# Patient Record
Sex: Female | Born: 1980 | Race: White | Hispanic: No | Marital: Married | Smoking: Never smoker
Health system: Southern US, Community
[De-identification: ages and names within clinical notes are randomized; demographics above are authoritative.]

## PROBLEM LIST (undated history)

## (undated) DIAGNOSIS — R51 Headache: Secondary | ICD-10-CM

## (undated) DIAGNOSIS — R519 Headache, unspecified: Secondary | ICD-10-CM

## (undated) DIAGNOSIS — Z8619 Personal history of other infectious and parasitic diseases: Secondary | ICD-10-CM

## (undated) HISTORY — DX: Headache: R51

## (undated) HISTORY — PX: NASAL FRACTURE SURGERY: SHX718

## (undated) HISTORY — DX: Personal history of other infectious and parasitic diseases: Z86.19

## (undated) HISTORY — DX: Headache, unspecified: R51.9

## (undated) HISTORY — PX: DILATION AND CURETTAGE OF UTERUS: SHX78

---

## 2010-10-22 ENCOUNTER — Inpatient Hospital Stay (HOSPITAL_COMMUNITY)
Admission: RE | Admit: 2010-10-22 | Discharge: 2010-10-25 | DRG: 373 | Disposition: A | Discharge status: Home / Self Care | Payer: BC Managed Care – PPO | Source: Ambulatory Visit | Attending: Obstetrics and Gynecology | Admitting: Obstetrics and Gynecology

## 2010-10-22 DIAGNOSIS — O365990 Maternal care for other known or suspected poor fetal growth, unspecified trimester, not applicable or unspecified: Principal | ICD-10-CM | POA: Diagnosis present

## 2010-10-22 DIAGNOSIS — Z3490 Encounter for supervision of normal pregnancy, unspecified, unspecified trimester: Secondary | ICD-10-CM | POA: Diagnosis present

## 2010-10-22 DIAGNOSIS — O99892 Other specified diseases and conditions complicating childbirth: Secondary | ICD-10-CM | POA: Diagnosis present

## 2010-10-22 DIAGNOSIS — L909 Atrophic disorder of skin, unspecified: Secondary | ICD-10-CM | POA: Diagnosis present

## 2010-10-22 DIAGNOSIS — L919 Hypertrophic disorder of the skin, unspecified: Secondary | ICD-10-CM | POA: Diagnosis present

## 2010-10-22 LAB — CBC
MCH: 32 pg (ref 26.0–34.0)
MCHC: 35.8 g/dL (ref 30.0–36.0)
MCV: 89.3 fL (ref 78.0–100.0)
Platelets: 326 10*3/uL (ref 150–400)
RDW: 12.8 % (ref 11.5–15.5)
WBC: 9.4 10*3/uL (ref 4.0–10.5)

## 2010-10-23 ENCOUNTER — Encounter (HOSPITAL_COMMUNITY): Payer: Self-pay | Admitting: Registered Nurse

## 2010-10-23 ENCOUNTER — Other Ambulatory Visit: Payer: Self-pay | Admitting: Obstetrics and Gynecology

## 2010-10-23 NOTE — Anesthesia Preprocedure Evaluation (Addendum)
Anesthesia Evaluation  General Assessment Comment  History of Anesthesia Complications (+) AWARENESS UNDER ANESTHESIA  Airway       Dental   Pulmonary      Cardiovascular    Neuro/Psych  GI/Hepatic/Renal   Endo/Other   Abdominal   Musculoskeletal  Hematology   Peds  Reproductive/Obstetrics          Anesthesia Physical Anesthesia Plan Anesthesia Quick Evaluation

## 2010-10-23 NOTE — Anesthesia Procedure Notes (Signed)
Performed by: Donney Dice D

## 2010-10-24 LAB — CBC
MCH: 31.3 pg (ref 26.0–34.0)
MCHC: 34.8 g/dL (ref 30.0–36.0)
Platelets: 298 10*3/uL (ref 150–400)
RBC: 3.61 MIL/uL — ABNORMAL LOW (ref 3.87–5.11)
RDW: 12.7 % (ref 11.5–15.5)

## 2010-10-24 MED ORDER — DIBUCAINE 1 % RE OINT: TOPICAL_OINTMENT | RECTAL | Status: DC | PRN

## 2010-10-24 MED ORDER — HYDROCORTISONE ACE-PRAMOXINE 1-1 % RE FOAM: 1.0000 | RECTAL | Status: DC | PRN

## 2010-10-24 MED ORDER — ZOLPIDEM TARTRATE 5 MG PO TABS: 5.0000 mg | ORAL_TABLET | Freq: Every evening | ORAL | Status: DC | PRN

## 2010-10-24 MED ORDER — MENTHOL 3 MG MT LOZG: 1.0000 | LOZENGE | OROMUCOSAL | Status: DC | PRN

## 2010-10-24 MED ORDER — METHYLERGONOVINE MALEATE 0.2 MG PO TABS: 0.2000 mg | ORAL_TABLET | ORAL | Status: DC | PRN

## 2010-10-24 MED ORDER — CALCIUM CARBONATE ANTACID 500 MG PO CHEW: 1.0000 | CHEWABLE_TABLET | ORAL | Status: DC | PRN

## 2010-10-24 MED ORDER — WITCH HAZEL-GLYCERIN EX PADS
MEDICATED_PAD | CUTANEOUS | Status: DC | PRN
  Filled 2010-10-24: qty 100

## 2010-10-24 MED ORDER — SODIUM CHLORIDE 0.9 % IJ SOLN
3.0000 mL | INTRAMUSCULAR | Status: DC | PRN
  Filled 2010-10-24: qty 3

## 2010-10-24 MED ORDER — BISACODYL 10 MG RE SUPP: 10.0000 mg | Freq: Every day | RECTAL | Status: DC | PRN

## 2010-10-24 MED ORDER — FLEET ENEMA 7-19 GM/118ML RE ENEM: 1.0000 | ENEMA | Freq: Every day | RECTAL | Status: DC | PRN

## 2010-10-24 MED ORDER — OXYCODONE-ACETAMINOPHEN 5-325 MG PO TABS: 1.0000 | ORAL_TABLET | ORAL | Status: DC | PRN

## 2010-10-24 MED ORDER — SIMETHICONE 80 MG PO CHEW
80.0000 mg | CHEWABLE_TABLET | ORAL | Status: DC | PRN
  Filled 2010-10-24: qty 1

## 2010-10-24 MED ORDER — BENZOCAINE-MENTHOL 20-0.5 % EX AERO: 1.0000 "application " | INHALATION_SPRAY | Freq: Four times a day (QID) | CUTANEOUS | Status: DC | PRN

## 2010-10-24 MED ORDER — FERROUS SULFATE 325 (65 FE) MG PO TABS
325.0000 mg | ORAL_TABLET | Freq: Two times a day (BID) | ORAL | Status: DC
  Administered 2010-10-25: 325 mg via ORAL
  Filled 2010-10-24: qty 1

## 2010-10-24 MED ORDER — GUAIFENESIN 100 MG/5ML PO SOLN: 15.0000 mL | ORAL | Status: DC | PRN

## 2010-10-24 MED ORDER — PSEUDOEPHEDRINE HCL 30 MG PO TABS: 60.0000 mg | ORAL_TABLET | ORAL | Status: DC | PRN

## 2010-10-24 MED ORDER — SODIUM CHLORIDE 0.9 % IJ SOLN
3.0000 mL | Freq: Two times a day (BID) | INTRAMUSCULAR | Status: DC
  Filled 2010-10-24 (×3): qty 3

## 2010-10-24 MED ORDER — DIPHENHYDRAMINE HCL 25 MG PO CAPS: 25.0000 mg | ORAL_CAPSULE | Freq: Four times a day (QID) | ORAL | Status: DC | PRN

## 2010-10-24 MED ORDER — ONDANSETRON HCL 4 MG/2ML IJ SOLN: 4.0000 mg | INTRAMUSCULAR | Status: DC | PRN

## 2010-10-24 MED ORDER — METHYLERGONOVINE MALEATE 0.2 MG/ML IJ SOLN: 0.2000 mg | Freq: Four times a day (QID) | INTRAMUSCULAR | Status: DC | PRN

## 2010-10-24 MED ORDER — PRENATAL 27-0.8 MG PO TABS
1.0000 | ORAL_TABLET | Freq: Every day | ORAL | Status: DC
  Administered 2010-10-25: 1 via ORAL
  Filled 2010-10-24 (×3): qty 1

## 2010-10-24 MED ORDER — ONDANSETRON HCL 4 MG PO TABS: 4.0000 mg | ORAL_TABLET | ORAL | Status: DC | PRN

## 2010-10-24 MED ORDER — MAGNESIUM HYDROXIDE 400 MG/5ML PO SUSP: 30.0000 mL | ORAL | Status: DC | PRN

## 2010-10-24 MED ORDER — OXYTOCIN 20 UNITS IN LACTATED RINGERS INFUSION - SIMPLE: 125.0000 mL/h | INTRAVENOUS | Status: DC | PRN

## 2010-10-24 MED ORDER — FLEET ENEMA 7-19 GM/118ML RE ENEM
1.0000 | ENEMA | Freq: Every day | RECTAL | Status: DC | PRN
  Filled 2010-10-24: qty 1

## 2010-10-24 MED ORDER — MEASLES, MUMPS & RUBELLA VAC ~~LOC~~ INJ
0.5000 mL | INJECTION | Freq: Once | SUBCUTANEOUS | Status: DC | PRN
  Filled 2010-10-24: qty 0.5

## 2010-10-24 MED ORDER — SENNOSIDES-DOCUSATE SODIUM 8.6-50 MG PO TABS
1.0000 | ORAL_TABLET | Freq: Every day | ORAL | Status: DC
  Filled 2010-10-24: qty 2

## 2010-10-24 MED ORDER — IBUPROFEN 600 MG PO TABS
600.0000 mg | ORAL_TABLET | Freq: Four times a day (QID) | ORAL | Status: DC
  Administered 2010-10-25 (×2): 600 mg via ORAL
  Filled 2010-10-24 (×2): qty 1

## 2010-10-25 ENCOUNTER — Encounter (HOSPITAL_COMMUNITY): Payer: Self-pay | Admitting: Obstetrics and Gynecology

## 2010-10-25 DIAGNOSIS — Z3490 Encounter for supervision of normal pregnancy, unspecified, unspecified trimester: Secondary | ICD-10-CM | POA: Diagnosis present

## 2010-10-25 NOTE — Progress Notes (Signed)
Post Partum Day2   Subjective: no complaints  Objective: Blood pressure 117/80, pulse 88, temperature 98.2 F (36.8 C), temperature source Oral, resp. rate 18, height 5\' 4"  (1.626 m), weight 66.8 kg (147 lb 4.3 oz).  Physical Exam:  General: alert Lochia: appropriate Uterine Fundus: firm  DVT Evaluation: No evidence of DVT seen on physical exam.   Basename 10/24/10 0605 10/22/10 2040  HGB 11.3* 12.5  HCT 32.5* 34.9*    Assessment/Plan: Discharge home   LOS: 3 days   Jerman Tinnon L 10/25/2010, 10:08 AM

## 2010-10-26 LAB — RH IMMUNE GLOB WKUP(>/=20WKS)(NOT WOMEN'S HOSP)

## 2010-10-29 ENCOUNTER — Inpatient Hospital Stay (HOSPITAL_COMMUNITY): Admission: AD | Admit: 2010-10-29 | Payer: Self-pay | Source: Home / Self Care | Admitting: Obstetrics and Gynecology

## 2010-11-17 ENCOUNTER — Inpatient Hospital Stay (HOSPITAL_COMMUNITY): Admission: AD | Admit: 2010-11-17 | Payer: Self-pay | Source: Ambulatory Visit | Admitting: Obstetrics and Gynecology

## 2013-03-06 ENCOUNTER — Ambulatory Visit (HOSPITAL_COMMUNITY)
Admission: AD | Admit: 2013-03-06 | Discharge: 2013-03-06 | Disposition: A | Discharge status: Home / Self Care | Payer: BC Managed Care – PPO | Source: Ambulatory Visit | Attending: Obstetrics and Gynecology | Admitting: Obstetrics and Gynecology

## 2014-04-02 LAB — OB RESULTS CONSOLE GC/CHLAMYDIA
Chlamydia: NEGATIVE
GC PROBE AMP, GENITAL: NEGATIVE

## 2014-04-02 LAB — OB RESULTS CONSOLE RPR: RPR: NONREACTIVE

## 2014-04-02 LAB — OB RESULTS CONSOLE HEPATITIS B SURFACE ANTIGEN: Hepatitis B Surface Ag: NEGATIVE

## 2014-04-02 LAB — OB RESULTS CONSOLE HIV ANTIBODY (ROUTINE TESTING): HIV: NONREACTIVE

## 2014-04-02 LAB — OB RESULTS CONSOLE RUBELLA ANTIBODY, IGM: Rubella: NON-IMMUNE/NOT IMMUNE

## 2014-04-02 LAB — OB RESULTS CONSOLE ABO/RH: RH Type: NEGATIVE

## 2014-04-02 LAB — OB RESULTS CONSOLE ANTIBODY SCREEN: ANTIBODY SCREEN: NEGATIVE

## 2014-04-11 ENCOUNTER — Other Ambulatory Visit: Payer: Self-pay | Admitting: Obstetrics and Gynecology

## 2014-04-12 LAB — CYTOLOGY - PAP

## 2014-10-15 LAB — OB RESULTS CONSOLE GBS: GBS: POSITIVE

## 2014-10-31 ENCOUNTER — Encounter (HOSPITAL_COMMUNITY): Payer: Self-pay | Admitting: *Deleted

## 2014-10-31 ENCOUNTER — Telehealth (HOSPITAL_COMMUNITY): Payer: Self-pay | Admitting: *Deleted

## 2014-10-31 NOTE — Telephone Encounter (Signed)
Preadmission screen  

## 2014-11-07 ENCOUNTER — Encounter (HOSPITAL_COMMUNITY): Payer: Self-pay

## 2014-11-07 ENCOUNTER — Inpatient Hospital Stay (HOSPITAL_COMMUNITY)
Admission: RE | Admit: 2014-11-07 | Discharge: 2014-11-08 | DRG: 775 | Disposition: A | Discharge status: Home / Self Care | Payer: BC Managed Care – PPO | Source: Ambulatory Visit | Attending: Obstetrics and Gynecology | Admitting: Obstetrics and Gynecology

## 2014-11-07 ENCOUNTER — Inpatient Hospital Stay (HOSPITAL_COMMUNITY): Payer: BC Managed Care – PPO | Admitting: Anesthesiology

## 2014-11-07 DIAGNOSIS — Z833 Family history of diabetes mellitus: Secondary | ICD-10-CM | POA: Diagnosis not present

## 2014-11-07 DIAGNOSIS — Z3A40 40 weeks gestation of pregnancy: Secondary | ICD-10-CM | POA: Diagnosis present

## 2014-11-07 DIAGNOSIS — Z8249 Family history of ischemic heart disease and other diseases of the circulatory system: Secondary | ICD-10-CM | POA: Diagnosis not present

## 2014-11-07 DIAGNOSIS — Z3490 Encounter for supervision of normal pregnancy, unspecified, unspecified trimester: Secondary | ICD-10-CM

## 2014-11-07 DIAGNOSIS — O9989 Other specified diseases and conditions complicating pregnancy, childbirth and the puerperium: Secondary | ICD-10-CM | POA: Diagnosis present

## 2014-11-07 LAB — CBC
HEMATOCRIT: 36 % (ref 36.0–46.0)
Hemoglobin: 12.5 g/dL (ref 12.0–15.0)
MCH: 30.5 pg (ref 26.0–34.0)
MCHC: 34.7 g/dL (ref 30.0–36.0)
MCV: 87.8 fL (ref 78.0–100.0)
Platelets: 332 10*3/uL (ref 150–400)
RBC: 4.1 MIL/uL (ref 3.87–5.11)
RDW: 13.1 % (ref 11.5–15.5)
WBC: 7.1 10*3/uL (ref 4.0–10.5)

## 2014-11-07 LAB — RPR: RPR Ser Ql: NONREACTIVE

## 2014-11-07 MED ORDER — DIPHENHYDRAMINE HCL 25 MG PO CAPS: 25.0000 mg | ORAL_CAPSULE | Freq: Four times a day (QID) | ORAL | Status: DC | PRN

## 2014-11-07 MED ORDER — EPHEDRINE 5 MG/ML INJ
10.0000 mg | INTRAVENOUS | Status: DC | PRN
  Filled 2014-11-07: qty 2

## 2014-11-07 MED ORDER — PRENATAL MULTIVITAMIN CH
1.0000 | ORAL_TABLET | Freq: Every day | ORAL | Status: DC
  Administered 2014-11-08: 1 via ORAL
  Filled 2014-11-07: qty 1

## 2014-11-07 MED ORDER — ONDANSETRON HCL 4 MG/2ML IJ SOLN: 4.0000 mg | Freq: Four times a day (QID) | INTRAMUSCULAR | Status: DC | PRN

## 2014-11-07 MED ORDER — OXYTOCIN BOLUS FROM INFUSION: 500.0000 mL | INTRAVENOUS | Status: DC

## 2014-11-07 MED ORDER — LACTATED RINGERS IV SOLN
INTRAVENOUS | Status: DC
  Administered 2014-11-07: 950 mL via INTRAVENOUS

## 2014-11-07 MED ORDER — FENTANYL 2.5 MCG/ML BUPIVACAINE 1/10 % EPIDURAL INFUSION (WH - ANES)
14.0000 mL/h | INTRAMUSCULAR | Status: DC | PRN
  Administered 2014-11-07: 14 mL/h via EPIDURAL
  Filled 2014-11-07: qty 125

## 2014-11-07 MED ORDER — OXYCODONE-ACETAMINOPHEN 5-325 MG PO TABS: 1.0000 | ORAL_TABLET | ORAL | Status: DC | PRN

## 2014-11-07 MED ORDER — ZOLPIDEM TARTRATE 5 MG PO TABS: 5.0000 mg | ORAL_TABLET | Freq: Every evening | ORAL | Status: DC | PRN

## 2014-11-07 MED ORDER — IBUPROFEN 600 MG PO TABS
600.0000 mg | ORAL_TABLET | Freq: Four times a day (QID) | ORAL | Status: DC
  Administered 2014-11-07 – 2014-11-08 (×4): 600 mg via ORAL
  Filled 2014-11-07 (×4): qty 1

## 2014-11-07 MED ORDER — DIBUCAINE 1 % RE OINT: 1.0000 "application " | TOPICAL_OINTMENT | RECTAL | Status: DC | PRN

## 2014-11-07 MED ORDER — LIDOCAINE HCL (PF) 1 % IJ SOLN
30.0000 mL | INTRAMUSCULAR | Status: DC | PRN
  Filled 2014-11-07: qty 30

## 2014-11-07 MED ORDER — FLEET ENEMA 7-19 GM/118ML RE ENEM: 1.0000 | ENEMA | RECTAL | Status: DC | PRN

## 2014-11-07 MED ORDER — ACETAMINOPHEN 325 MG PO TABS: 650.0000 mg | ORAL_TABLET | ORAL | Status: DC | PRN

## 2014-11-07 MED ORDER — TETANUS-DIPHTH-ACELL PERTUSSIS 5-2.5-18.5 LF-MCG/0.5 IM SUSP: 0.5000 mL | Freq: Once | INTRAMUSCULAR | Status: DC

## 2014-11-07 MED ORDER — OXYCODONE-ACETAMINOPHEN 5-325 MG PO TABS: 2.0000 | ORAL_TABLET | ORAL | Status: DC | PRN

## 2014-11-07 MED ORDER — DEXTROSE 5 % IV SOLN
5.0000 10*6.[IU] | Freq: Once | INTRAVENOUS | Status: AC
  Administered 2014-11-07: 5 10*6.[IU] via INTRAVENOUS
  Filled 2014-11-07: qty 5

## 2014-11-07 MED ORDER — TERBUTALINE SULFATE 1 MG/ML IJ SOLN
0.2500 mg | Freq: Once | INTRAMUSCULAR | Status: DC | PRN
  Filled 2014-11-07: qty 1

## 2014-11-07 MED ORDER — SIMETHICONE 80 MG PO CHEW: 80.0000 mg | CHEWABLE_TABLET | ORAL | Status: DC | PRN

## 2014-11-07 MED ORDER — LANOLIN HYDROUS EX OINT: TOPICAL_OINTMENT | CUTANEOUS | Status: DC | PRN

## 2014-11-07 MED ORDER — LACTATED RINGERS IV SOLN
500.0000 mL | INTRAVENOUS | Status: DC | PRN
  Administered 2014-11-07: 1000 mL via INTRAVENOUS

## 2014-11-07 MED ORDER — BENZOCAINE-MENTHOL 20-0.5 % EX AERO
1.0000 "application " | INHALATION_SPRAY | CUTANEOUS | Status: DC | PRN
  Administered 2014-11-07: 1 via TOPICAL
  Filled 2014-11-07: qty 56

## 2014-11-07 MED ORDER — MEDROXYPROGESTERONE ACETATE 150 MG/ML IM SUSP: 150.0000 mg | INTRAMUSCULAR | Status: DC | PRN

## 2014-11-07 MED ORDER — FENTANYL 2.5 MCG/ML BUPIVACAINE 1/10 % EPIDURAL INFUSION (WH - ANES): 14.0000 mL/h | INTRAMUSCULAR | Status: DC | PRN

## 2014-11-07 MED ORDER — CITRIC ACID-SODIUM CITRATE 334-500 MG/5ML PO SOLN: 30.0000 mL | ORAL | Status: DC | PRN

## 2014-11-07 MED ORDER — MEASLES, MUMPS & RUBELLA VAC ~~LOC~~ INJ
0.5000 mL | INJECTION | Freq: Once | SUBCUTANEOUS | Status: DC
  Filled 2014-11-07: qty 0.5

## 2014-11-07 MED ORDER — ONDANSETRON HCL 4 MG PO TABS: 4.0000 mg | ORAL_TABLET | ORAL | Status: DC | PRN

## 2014-11-07 MED ORDER — PHENYLEPHRINE 40 MCG/ML (10ML) SYRINGE FOR IV PUSH (FOR BLOOD PRESSURE SUPPORT)
80.0000 ug | PREFILLED_SYRINGE | INTRAVENOUS | Status: DC | PRN
  Filled 2014-11-07: qty 2
  Filled 2014-11-07: qty 20

## 2014-11-07 MED ORDER — DIPHENHYDRAMINE HCL 50 MG/ML IJ SOLN: 12.5000 mg | INTRAMUSCULAR | Status: DC | PRN

## 2014-11-07 MED ORDER — SENNOSIDES-DOCUSATE SODIUM 8.6-50 MG PO TABS
2.0000 | ORAL_TABLET | ORAL | Status: DC
  Administered 2014-11-08: 2 via ORAL
  Filled 2014-11-07: qty 2

## 2014-11-07 MED ORDER — OXYTOCIN 40 UNITS IN LACTATED RINGERS INFUSION - SIMPLE MED
62.5000 mL/h | INTRAVENOUS | Status: DC
  Administered 2014-11-07: 62.5 mL/h via INTRAVENOUS
  Filled 2014-11-07: qty 1000

## 2014-11-07 MED ORDER — PENICILLIN G POTASSIUM 5000000 UNITS IJ SOLR
2.5000 10*6.[IU] | INTRAVENOUS | Status: DC
  Administered 2014-11-07: 2.5 10*6.[IU] via INTRAVENOUS
  Filled 2014-11-07 (×5): qty 2.5

## 2014-11-07 MED ORDER — ONDANSETRON HCL 4 MG/2ML IJ SOLN: 4.0000 mg | INTRAMUSCULAR | Status: DC | PRN

## 2014-11-07 MED ORDER — WITCH HAZEL-GLYCERIN EX PADS: 1.0000 "application " | MEDICATED_PAD | CUTANEOUS | Status: DC | PRN

## 2014-11-07 MED ORDER — OXYTOCIN 40 UNITS IN LACTATED RINGERS INFUSION - SIMPLE MED
1.0000 m[IU]/min | INTRAVENOUS | Status: DC
  Administered 2014-11-07: 2 m[IU]/min via INTRAVENOUS

## 2014-11-07 NOTE — Progress Notes (Signed)
Pt comfortable w/ epidural  FHT cat 1 Toco Q5 Cvx 5/90/-1  A/P:  Exp mngt

## 2014-11-07 NOTE — H&P (Signed)
Autumn Haney is a 34 y.o. female G4P1 @ 40 wks presenting for IOL.  No ctx, lof or vb.  History OB History    Gravida Para Term Preterm AB TAB SAB Ectopic Multiple Living   4 1 1  2  2   1      Past Medical History  Diagnosis Date  . Normal pregnancy 10/25/2010  . Hx of varicella   . Headache     rare migraines   Past Surgical History  Procedure Laterality Date  . Nasal fracture surgery    . Dilation and curettage of uterus     Family History: family history includes Diabetes in her paternal grandfather; Hypertension in her father. Social History:  reports that she has never smoked. She does not have any smokeless tobacco history on file. She reports that she does not drink alcohol or use illicit drugs.   Prenatal Transfer Tool  Maternal Diabetes: No Genetic Screening: normal Maternal Ultrasounds/Referrals: Abnormal:  Findings:   Other:  Increased NT Fetal Ultrasounds or other Referrals:  Fetal echo - nml Maternal Substance Abuse:  No Significant Maternal Medications:  None Significant Maternal Lab Results:  None Other Comments:  None  ROS  Dilation: 4 Effacement (%): 60 Station: -2 Exam by:: Kijana Estock Blood pressure 123/81, pulse 90, temperature 97.9 F (36.6 C), resp. rate 18, height 5\' 4"  (1.626 m), weight 153 lb (69.4 kg), last menstrual period 01/31/2014. Exam Physical Exam  Gen - NAD Abd - gravid, NT Ext - NT Cvx 4/60/-2 AROM - clear  Prenatal labs: ABO, Rh: A/Negative/-- (12/14 0000) Antibody: Negative (12/14 0000) Rubella: Nonimmune (12/14 0000) RPR: Nonreactive (12/14 0000)  HBsAg: Negative (12/14 0000)  HIV: Non-reactive (12/14 0000)  GBS: Positive (06/27 0000)   Assessment/Plan: Admit AROM/pitocin   Phiona Ramnauth 11/07/2014, 8:09 AM

## 2014-11-07 NOTE — Anesthesia Preprocedure Evaluation (Signed)

## 2014-11-07 NOTE — Anesthesia Postprocedure Evaluation (Signed)
  Anesthesia Post-op Note  Patient: Autumn Haney  Procedure(s) Performed: * No procedures listed *  Patient Location: PACU and Mother/Baby  Anesthesia Type:Epidural  Level of Consciousness: awake, alert , oriented and patient cooperative  Airway and Oxygen Therapy: Patient Spontanous Breathing  Post-op Pain: mild  Post-op Assessment: Post-op Vital signs reviewed, Patient's Cardiovascular Status Stable, Respiratory Function Stable, Patent Airway, No signs of Nausea or vomiting, Adequate PO intake, Pain level controlled, No headache, No backache and Patient able to bend at knees              Post-op Vital Signs: Reviewed and stable  Last Vitals:  Filed Vitals:   11/07/14 1331  BP: 110/76  Pulse: 94  Temp: 36.6 C  Resp: 18    Complications: No apparent anesthesia complications

## 2014-11-07 NOTE — Anesthesia Procedure Notes (Signed)
Epidural Patient location during procedure: OB  Preanesthetic Checklist Completed: patient identified, site marked, surgical consent, pre-op evaluation, timeout performed, IV checked, risks and benefits discussed and monitors and equipment checked  Epidural Patient position: sitting Prep: site prepped and draped and DuraPrep Patient monitoring: continuous pulse ox and blood pressure Approach: midline Location: L3-L4 Injection technique: LOR air  Needle:  Needle type: Tuohy  Needle gauge: 17 G Needle length: 9 cm and 9 Needle insertion depth: 5 cm cm Catheter type: closed end flexible Catheter size: 19 Gauge Catheter at skin depth: 10 cm Test dose: negative  Assessment Events: blood not aspirated, injection not painful, no injection resistance, negative IV test and no paresthesia  Additional Notes Dosing of Epidural:  1st dose, through catheter ............................................Marland Kitchen  Xylocaine 40 mg  2nd dose, through catheter, after waiting 3 minutes........Marland KitchenXylocaine 60 mg    ( 1% Xylo charted as a single dose in Epic Meds for ease of charting; actual dosing was fractionated as above, for saftey's sake)  As each dose occurred, patient was free of IV sx; and patient exhibited no evidence of SA injection.  Patient is more comfortable after epidural dosed. Please see RN's note for documentation of vital signs,and FHR which are stable.  Patient reminded not to try to ambulate with numb legs, and that an RN must be present when she attempts to get up.

## 2014-11-07 NOTE — Progress Notes (Signed)
SVD of vigorous female infant w/ apgars of 9,9.  Placenta delivered spontaneous w/ 3VC.   2nd degree lac repaired w/ 3-0 vicryl rapide.  Fundus firm.  EBL 63cc .

## 2014-11-08 LAB — CBC
HEMATOCRIT: 36.3 % (ref 36.0–46.0)
HEMOGLOBIN: 12.3 g/dL (ref 12.0–15.0)
MCH: 30.1 pg (ref 26.0–34.0)
MCHC: 33.9 g/dL (ref 30.0–36.0)
MCV: 89 fL (ref 78.0–100.0)
PLATELETS: 312 10*3/uL (ref 150–400)
RBC: 4.08 MIL/uL (ref 3.87–5.11)
RDW: 13.1 % (ref 11.5–15.5)
WBC: 9.5 10*3/uL (ref 4.0–10.5)

## 2014-11-08 MED ORDER — RHO D IMMUNE GLOBULIN 1500 UNIT/2ML IJ SOSY
300.0000 ug | PREFILLED_SYRINGE | Freq: Once | INTRAMUSCULAR | Status: AC
  Administered 2014-11-08: 300 ug via INTRAVENOUS
  Filled 2014-11-08: qty 2

## 2014-11-08 NOTE — Discharge Summary (Signed)
Obstetric Discharge Summary Reason for Admission: induction of labor Prenatal Procedures: ultrasound Intrapartum Procedures: spontaneous vaginal delivery Postpartum Procedures: none Complications-Operative and Postpartum: 2 degree perineal laceration HEMOGLOBIN  Date Value Ref Range Status  11/08/2014 12.3 12.0 - 15.0 g/dL Final   HCT  Date Value Ref Range Status  11/08/2014 36.3 36.0 - 46.0 % Final    Physical Exam:  General: alert and cooperative Lochia: appropriate Uterine Fundus: firm Incision: healing well DVT Evaluation: No evidence of DVT seen on physical exam. Negative Homan's sign. No cords or calf tenderness. No significant calf/ankle edema.  Discharge Diagnoses: Term Pregnancy-delivered  Discharge Information: Date: 11/08/2014 Activity: pelvic rest Diet: routine Medications: PNV and Ibuprofen Condition: stable Instructions: refer to practice specific booklet Discharge to: home   Newborn Data: Live born female  Birth Weight: 7 lb 1.6 oz (3220 g) APGAR: 9, 9  Home with mother.  Ronnell Clinger G 11/08/2014, 8:04 AM

## 2014-11-08 NOTE — Lactation Note (Signed)
This note was copied from the chart of [place]. Lactation Consultation Note: experienced BF mom. Reports baby has been latching well but would like [place] to observe latch. Baby was circ'd about 2 hours ago- unwrapped and undressed and latched well with lots of swallows noted. Tucking bottom lip under- reviewed with mom how to untuck it. Mom reports breasts are feeling fuller this afternoon. BF brochure given with resources for support after DC. No further questions at present. To call for assist prn  Patient Name: Autumn Haney [id number] Date: 11/08/2014 Reason for consult: Initial assessment   Maternal Data Formula Feeding for Exclusion: No Does the patient have breastfeeding experience prior to this delivery?: Yes  Feeding Feeding Type: Breast Fed  LATCH Score/Interventions Latch: Grasps breast easily, tongue down, lips flanged, rhythmical sucking.  Audible Swallowing: Spontaneous and intermittent  Type of Nipple: Everted at rest and after stimulation  Comfort (Breast/Nipple): Soft / non-tender     Hold (Positioning): Assistance needed to correctly position infant at breast and maintain latch. Intervention(s): Breastfeeding basics reviewed  LATCH Score: 9  Lactation Tools Discussed/Used     Consult Status Consult Status: Follow-up Date: 11/09/14 Follow-up type: In-patient    Truddie Crumble 11/08/2014, 1:56 PM

## 2014-11-08 NOTE — Progress Notes (Signed)
Post Partum Day 1 Subjective: no complaints, up ad lib, voiding, tolerating PO, + flatus and desres early discharge and baby circ  Objective: Blood pressure 127/73, pulse 86, temperature 98.3 F (36.8 C), temperature source Oral, resp. rate 18, height 5\' 4"  (1.626 m), weight 153 lb (69.4 kg), last menstrual period 01/31/2014, SpO2 100 %, unknown if currently breastfeeding.  Physical Exam:  General: alert and cooperative Lochia: appropriate Uterine Fundus: firm Incision: healing well DVT Evaluation: No evidence of DVT seen on physical exam. Negative Homan's sign. No cords or calf tenderness. No significant calf/ankle edema.   Recent Labs  11/07/14 0735 11/08/14 0610  HGB 12.5 12.3  HCT 36.0 36.3    Assessment/Plan: Discharge home and Circumcision prior to discharge   LOS: 1 day   Louvina Cleary G 11/08/2014, 7:53 AM

## 2014-11-08 NOTE — Plan of Care (Signed)
Problem: Phase II Progression Outcomes Goal: Rh isoimmunization per orders Outcome: Not Met (add Reason) Needs Rhogam in AM

## 2014-11-09 LAB — RH IG WORKUP (INCLUDES ABO/RH)
ABO/RH(D): A NEG
Fetal Screen: NEGATIVE
Gestational Age(Wks): 40
Unit division: 0

## 2014-11-11 LAB — TYPE AND SCREEN
ABO/RH(D): A NEG
ANTIBODY SCREEN: POSITIVE
DAT, IGG: NEGATIVE
UNIT DIVISION: 0
Unit division: 0

## 2014-12-20 ENCOUNTER — Other Ambulatory Visit: Payer: Self-pay | Admitting: Obstetrics and Gynecology

## 2014-12-25 LAB — CYTOLOGY - PAP

## 2019-06-03 ENCOUNTER — Ambulatory Visit: Payer: BC Managed Care – PPO

## 2019-06-05 ENCOUNTER — Ambulatory Visit: Payer: BC Managed Care – PPO | Attending: Internal Medicine

## 2019-06-05 DIAGNOSIS — Z23 Encounter for immunization: Secondary | ICD-10-CM

## 2019-06-05 NOTE — Progress Notes (Signed)
   Covid-19 Vaccination Clinic  Name:  Autumn Haney    MRN: [id number] DOB: 1980-07-16  06/05/2019  [name] was observed post Covid-19 immunization for 15 minutes without incidence. She was provided with Vaccine Information Sheet and instruction to access the V-Safe system.   [name] was instructed to call 911 with any severe reactions post vaccine: Marland Kitchen Difficulty breathing  . Swelling of your face and throat  . A fast heartbeat  . A bad rash all over your body  . Dizziness and weakness    Immunizations Administered    Name Date Dose VIS Date Route   Pfizer COVID-19 Vaccine 06/05/2019  6:35 PM 0.3 mL 03/31/2019 Intramuscular   Manufacturer: [place]   Lot: [id number]   [place]: [id number]

## 2019-06-27 ENCOUNTER — Ambulatory Visit: Payer: BC Managed Care – PPO | Attending: Internal Medicine

## 2019-06-27 DIAGNOSIS — Z23 Encounter for immunization: Secondary | ICD-10-CM

## 2019-06-27 NOTE — Progress Notes (Signed)
   Covid-19 Vaccination Clinic  Name:  Autumn Haney    MRN: [id number] DOB: Mar 14, 1981  06/27/2019  Autumn Haney was observed post Covid-19 immunization for 15 minutes without incident. She was provided with Vaccine Information Sheet and instruction to access the V-Safe system.   Autumn Haney was instructed to call 911 with any severe reactions post vaccine: Marland Kitchen Difficulty breathing  . Swelling of face and throat  . A fast heartbeat  . A bad rash all over body  . Dizziness and weakness   Immunizations Administered    Name Date Dose VIS Date Route   Pfizer COVID-19 Vaccine 06/27/2019 12:13 PM 0.3 mL 03/31/2019 Intramuscular   Manufacturer: [place]   Lot: [id number]   [place]: [id number]

## 2019-06-28 ENCOUNTER — Ambulatory Visit: Payer: BC Managed Care – PPO

## 2019-09-02 ENCOUNTER — Encounter (HOSPITAL_BASED_OUTPATIENT_CLINIC_OR_DEPARTMENT_OTHER): Payer: Self-pay | Admitting: Emergency Medicine

## 2019-09-02 ENCOUNTER — Other Ambulatory Visit: Payer: Self-pay

## 2019-09-02 ENCOUNTER — Inpatient Hospital Stay (HOSPITAL_BASED_OUTPATIENT_CLINIC_OR_DEPARTMENT_OTHER)
Admission: EM | Admit: 2019-09-02 | Discharge: 2019-09-05 | DRG: 066 | Disposition: A | Discharge status: Home / Self Care | Payer: BC Managed Care – PPO | Attending: Internal Medicine | Admitting: Internal Medicine

## 2019-09-02 ENCOUNTER — Emergency Department (HOSPITAL_BASED_OUTPATIENT_CLINIC_OR_DEPARTMENT_OTHER): Payer: BC Managed Care – PPO

## 2019-09-02 DIAGNOSIS — I639 Cerebral infarction, unspecified: Secondary | ICD-10-CM

## 2019-09-02 DIAGNOSIS — I63441 Cerebral infarction due to embolism of right cerebellar artery: Secondary | ICD-10-CM | POA: Diagnosis not present

## 2019-09-02 DIAGNOSIS — Z833 Family history of diabetes mellitus: Secondary | ICD-10-CM

## 2019-09-02 DIAGNOSIS — I63541 Cerebral infarction due to unspecified occlusion or stenosis of right cerebellar artery: Secondary | ICD-10-CM | POA: Diagnosis present

## 2019-09-02 DIAGNOSIS — E876 Hypokalemia: Secondary | ICD-10-CM | POA: Diagnosis not present

## 2019-09-02 DIAGNOSIS — E785 Hyperlipidemia, unspecified: Secondary | ICD-10-CM | POA: Diagnosis present

## 2019-09-02 DIAGNOSIS — Z20822 Contact with and (suspected) exposure to covid-19: Secondary | ICD-10-CM | POA: Diagnosis present

## 2019-09-02 DIAGNOSIS — R55 Syncope and collapse: Secondary | ICD-10-CM | POA: Diagnosis not present

## 2019-09-02 DIAGNOSIS — D75839 Thrombocytosis, unspecified: Secondary | ICD-10-CM | POA: Diagnosis present

## 2019-09-02 DIAGNOSIS — R29700 NIHSS score 0: Secondary | ICD-10-CM | POA: Diagnosis present

## 2019-09-02 DIAGNOSIS — R7989 Other specified abnormal findings of blood chemistry: Secondary | ICD-10-CM | POA: Diagnosis present

## 2019-09-02 DIAGNOSIS — R4781 Slurred speech: Secondary | ICD-10-CM | POA: Diagnosis present

## 2019-09-02 DIAGNOSIS — Z8249 Family history of ischemic heart disease and other diseases of the circulatory system: Secondary | ICD-10-CM

## 2019-09-02 DIAGNOSIS — D509 Iron deficiency anemia, unspecified: Secondary | ICD-10-CM | POA: Diagnosis present

## 2019-09-02 LAB — URINALYSIS, ROUTINE W REFLEX MICROSCOPIC
Bilirubin Urine: NEGATIVE
Glucose, UA: NEGATIVE mg/dL
Hgb urine dipstick: NEGATIVE
Ketones, ur: NEGATIVE mg/dL
Leukocytes,Ua: NEGATIVE
Nitrite: NEGATIVE
Protein, ur: NEGATIVE mg/dL
Specific Gravity, Urine: 1.025 (ref 1.005–1.030)
pH: 6 (ref 5.0–8.0)

## 2019-09-02 LAB — BASIC METABOLIC PANEL
Anion gap: 10 (ref 5–15)
BUN: 16 mg/dL (ref 6–20)
CO2: 23 mmol/L (ref 22–32)
Calcium: 9 mg/dL (ref 8.9–10.3)
Chloride: 102 mmol/L (ref 98–111)
Creatinine, Ser: 0.7 mg/dL (ref 0.44–1.00)
GFR calc Af Amer: >60 mL/min (ref >60)
GFR calc non Af Amer: >60 mL/min (ref >60)
Glucose, Bld: 99 mg/dL (ref 70–99)
Potassium: 3.9 mmol/L (ref 3.5–5.1)
Sodium: 135 mmol/L (ref 135–145)

## 2019-09-02 LAB — PREGNANCY, URINE: Preg Test, Ur: NEGATIVE

## 2019-09-02 LAB — CBC
HCT: 33.9 % — ABNORMAL LOW (ref 36.0–46.0)
Hemoglobin: 10.6 g/dL — ABNORMAL LOW (ref 12.0–15.0)
MCH: 23.5 pg — ABNORMAL LOW (ref 26.0–34.0)
MCHC: 31.3 g/dL (ref 30.0–36.0)
MCV: 75 fL — ABNORMAL LOW (ref 80.0–100.0)
Platelets: 581 10*3/uL — ABNORMAL HIGH (ref 150–400)
RBC: 4.52 MIL/uL (ref 3.87–5.11)
RDW: 14.9 % (ref 11.5–15.5)
WBC: 8.1 10*3/uL (ref 4.0–10.5)
nRBC: 0 % (ref 0.0–0.2)

## 2019-09-02 LAB — CBG MONITORING, ED
Glucose-Capillary: 69 mg/dL — ABNORMAL LOW (ref 70–99)
Glucose-Capillary: 104 mg/dL — ABNORMAL HIGH (ref 70–99)

## 2019-09-02 MED ORDER — SODIUM CHLORIDE 0.9 % IV BOLUS
1000.0000 mL | Freq: Once | INTRAVENOUS | Status: AC
  Administered 2019-09-02: 1000 mL via INTRAVENOUS

## 2019-09-02 MED ORDER — SODIUM CHLORIDE 0.9% FLUSH
3.0000 mL | Freq: Once | INTRAVENOUS | Status: DC
  Filled 2019-09-02: qty 3

## 2019-09-02 MED ORDER — METOCLOPRAMIDE HCL 5 MG/ML IJ SOLN
10.0000 mg | Freq: Once | INTRAMUSCULAR | Status: AC
  Administered 2019-09-02: 10 mg via INTRAVENOUS
  Filled 2019-09-02: qty 2

## 2019-09-02 MED ORDER — IOHEXOL 350 MG/ML SOLN
100.0000 mL | Freq: Once | INTRAVENOUS | Status: AC | PRN
  Administered 2019-09-02: 100 mL via INTRAVENOUS

## 2019-09-02 NOTE — ED Notes (Signed)
Dr. Ralene Bathe notified of CBG 69. 4oz apple juice given after swallow screen completed

## 2019-09-02 NOTE — ED Notes (Signed)
ED Provider at bedside. 

## 2019-09-02 NOTE — ED Triage Notes (Signed)
Patient states that at about 10 am she felt light head and felt like she was going to pass out. The patient reports that since she has been lightheaded and dizzy. The patient states that at 10 am she had some slurred speech but that has resolved. Denies any numbness or tingling. Denies any vomiting at this time.

## 2019-09-02 NOTE — ED Provider Notes (Signed)
[facility] EMERGENCY DEPARTMENT Provider Note   CSN: [id number] Arrival date & time: 09/02/19  1805     History Chief Complaint  Patient presents with  . Near Syncope    Autumn Haney is a 39 y.o. female.  The history is provided by the patient. No language interpreter was used.  Near Syncope   Autumn Haney is a 39 y.o. female who presents to the Emergency Department complaining of dizziness. She presents the emergency department complaining of sudden onset dizziness at 10 AM. She had a routine morning where she exercised, showered and ate breakfast. She was sitting down to dry her hair when she suddenly became dizzy and fell like she might pass out. She had slurred speech during the event. It lasted about 45 minutes. During the event she developed associated mild headache located behind her eyes bilaterally as well as pain to the back of her neck. Overall her neck pain is resolved but she does have ongoing mild to moderate headache. Her slurred speech has completely resolved but she continues to feel dizzy described as an off balance sensation. She has no known medical problems and takes no medications. She does drink about 1 to 2 drinks daily. Denies any tobacco or drug use. Symptoms are moderate and constant nature.    Past Medical History:  Diagnosis Date  . Headache    rare migraines  . Hx of varicella   . Normal pregnancy 10/25/2010    Patient Active Problem List   Diagnosis Date Noted  . Pregnancy 11/07/2014  . SVD (spontaneous vaginal delivery) 11/07/2014    Past Surgical History:  Procedure Laterality Date  . DILATION AND CURETTAGE OF UTERUS    . NASAL FRACTURE SURGERY       OB History    Gravida  4   Para  2   Term  2   Preterm      AB  2   Living  2     SAB  2   TAB      Ectopic      Multiple  0   Live Births  2           Family History  Problem Relation Age of Onset  . Hypertension Father   . Diabetes Paternal  Grandfather     Social History   Tobacco Use  . Smoking status: Never Smoker  Substance Use Topics  . Alcohol use: No  . Drug use: No    Home Medications Prior to Admission medications   Medication Sig Start Date End Date Taking? Authorizing Provider  prenatal vitamin w/FE, FA (PRENATAL 1 + 1) 27-1 MG TABS Take 1 tablet by mouth daily.      [provider]    Allergies    Patient has no known allergies.  Review of Systems   Review of Systems  Cardiovascular: Positive for near-syncope.  All other systems reviewed and are negative.   Physical Exam Updated Vital Signs BP (!) 142/99 (BP Location: Right Arm)   Pulse 91   Temp 99.6 F (37.6 C) (Oral)   Resp 16   Ht 5\' 4"  (1.626 m)   Wt 61.2 kg   LMP 08/03/2019   SpO2 100%   BMI 23.17 kg/m   Physical Exam Vitals and nursing note reviewed.  Constitutional:      Appearance: She is well-developed.  HENT:     Head: Normocephalic and atraumatic.  Eyes:     Extraocular Movements:  Extraocular movements intact.     Pupils: Pupils are equal, round, and reactive to light.  Cardiovascular:     Rate and Rhythm: Normal rate and regular rhythm.     Heart sounds: No murmur.  Pulmonary:     Effort: Pulmonary effort is normal. No respiratory distress.     Breath sounds: Normal breath sounds.  Abdominal:     Palpations: Abdomen is soft.     Tenderness: There is no abdominal tenderness. There is no guarding or rebound.  Musculoskeletal:        General: No tenderness.  Skin:    General: Skin is warm and dry.     Capillary Refill: Capillary refill takes less than 2 seconds.  Neurological:     Mental Status: She is alert and oriented to person, place, and time.     Comments: No asymmetry of facial movements. Visual fields are grossly intact. 5/5 strength in all four extremities with sensation to light touch intact in all four extremities.  No ataxia on FTN bilaterally. She is ataxic on attempting to stand.  Psychiatric:         Behavior: Behavior normal.     ED Results / Procedures / Treatments   Labs (all labs ordered are listed, but only abnormal results are displayed) Labs Reviewed  CBC - Abnormal; Notable for the following components:      Result Value   Hemoglobin 10.6 (*)    HCT 33.9 (*)    MCV 75.0 (*)    MCH 23.5 (*)    Platelets 581 (*)    All other components within normal limits  CBG MONITORING, ED - Abnormal; Notable for the following components:   Glucose-Capillary 69 (*)    All other components within normal limits  CBG MONITORING, ED - Abnormal; Notable for the following components:   Glucose-Capillary 104 (*)    All other components within normal limits  SARS CORONAVIRUS 2 BY RT PCR (HOSPITAL ORDER, [facility])  BASIC METABOLIC PANEL  URINALYSIS, ROUTINE W REFLEX MICROSCOPIC  PREGNANCY, URINE    EKG EKG Interpretation  Date/Time:  Saturday Sep 02 2019 22:21:30 EDT Ventricular Rate:  96 PR Interval:    QRS Duration: 90 QT Interval:  360 QTC Calculation: 455 R Axis:   55 Text Interpretation: Sinus rhythm RSR' in V1 or V2, right VCD or RVH Borderline T abnormalities, anterior leads no prior available for comparison Confirmed by Quintella Reichert [phone]) on 09/02/2019 10:37:37 PM   Radiology CT Angio Head W or Wo Contrast  Result Date: 09/02/2019 CLINICAL DATA:  Headache with neck pain and dizziness. EXAM: CT ANGIOGRAPHY HEAD AND NECK TECHNIQUE: Multidetector CT imaging of the head and neck was performed using the standard protocol during bolus administration of intravenous contrast. Multiplanar CT image reconstructions and MIPs were obtained to evaluate the vascular anatomy. Carotid stenosis measurements (when applicable) are obtained utilizing NASCET criteria, using the distal internal carotid diameter as the denominator. CONTRAST:  15mL OMNIPAQUE IOHEXOL 350 MG/ML SOLN COMPARISON:  None. FINDINGS: CT HEAD FINDINGS Brain: There is no mass, hemorrhage  or extra-axial collection. The size and configuration of the ventricles and extra-axial CSF spaces are normal. There is a small area of acute to subacute ischemia within the right cerebellum. Brain parenchyma is otherwise normal. Skull: The visualized skull base, calvarium and extracranial soft tissues are normal. Sinuses/Orbits: No fluid levels or advanced mucosal thickening of the visualized paranasal sinuses. No mastoid or middle ear effusion. The orbits are  normal. CTA NECK FINDINGS SKELETON: There is no bony spinal canal stenosis. No lytic or blastic lesion. OTHER NECK: Normal pharynx, larynx and major salivary glands. No cervical lymphadenopathy. Unremarkable thyroid gland. UPPER CHEST: No pneumothorax or pleural effusion. No nodules or masses. AORTIC ARCH: There is no calcific atherosclerosis of the aortic arch. There is no aneurysm, dissection or hemodynamically significant stenosis of the visualized portion of the aorta. Conventional 3 vessel aortic branching pattern. The visualized proximal subclavian arteries are widely patent. RIGHT CAROTID SYSTEM: Normal without aneurysm, dissection or stenosis. LEFT CAROTID SYSTEM: Normal without aneurysm, dissection or stenosis. VERTEBRAL ARTERIES: Left dominant configuration. Both origins are clearly patent. There is no dissection, occlusion or flow-limiting stenosis to the skull base (V1-V3 segments). CTA HEAD FINDINGS POSTERIOR CIRCULATION: --Vertebral arteries: Normal V4 segments. --Inferior cerebellar arteries: Normal. --Basilar artery: Normal. --Superior cerebellar arteries: Normal. --Posterior cerebral arteries (PCA): Normal. The right PCA is partially supplied by a posterior communicating artery (p-comm). ANTERIOR CIRCULATION: --Intracranial internal carotid arteries: Normal. --Anterior cerebral arteries (ACA): Normal. Both A1 segments are present. Patent anterior communicating artery (a-comm). --Middle cerebral arteries (MCA): Normal. VENOUS SINUSES: As  permitted by contrast timing, patent. ANATOMIC VARIANTS: None Review of the MIP images confirms the above findings. IMPRESSION: 1. Small area of acute to subacute ischemia within the right cerebellum. MRI recommended for further evaluation. 2. Normal CTA of the head and neck.  No cerebellar artery occlusion. Electronically Signed   By: Ulyses Jarred M.D.   On: 09/02/2019 23:34   CT Angio Neck W and/or Wo Contrast  Result Date: 09/02/2019 CLINICAL DATA:  Headache with neck pain and dizziness. EXAM: CT ANGIOGRAPHY HEAD AND NECK TECHNIQUE: Multidetector CT imaging of the head and neck was performed using the standard protocol during bolus administration of intravenous contrast. Multiplanar CT image reconstructions and MIPs were obtained to evaluate the vascular anatomy. Carotid stenosis measurements (when applicable) are obtained utilizing NASCET criteria, using the distal internal carotid diameter as the denominator. CONTRAST:  110mL OMNIPAQUE IOHEXOL 350 MG/ML SOLN COMPARISON:  None. FINDINGS: CT HEAD FINDINGS Brain: There is no mass, hemorrhage or extra-axial collection. The size and configuration of the ventricles and extra-axial CSF spaces are normal. There is a small area of acute to subacute ischemia within the right cerebellum. Brain parenchyma is otherwise normal. Skull: The visualized skull base, calvarium and extracranial soft tissues are normal. Sinuses/Orbits: No fluid levels or advanced mucosal thickening of the visualized paranasal sinuses. No mastoid or middle ear effusion. The orbits are normal. CTA NECK FINDINGS SKELETON: There is no bony spinal canal stenosis. No lytic or blastic lesion. OTHER NECK: Normal pharynx, larynx and major salivary glands. No cervical lymphadenopathy. Unremarkable thyroid gland. UPPER CHEST: No pneumothorax or pleural effusion. No nodules or masses. AORTIC ARCH: There is no calcific atherosclerosis of the aortic arch. There is no aneurysm, dissection or hemodynamically  significant stenosis of the visualized portion of the aorta. Conventional 3 vessel aortic branching pattern. The visualized proximal subclavian arteries are widely patent. RIGHT CAROTID SYSTEM: Normal without aneurysm, dissection or stenosis. LEFT CAROTID SYSTEM: Normal without aneurysm, dissection or stenosis. VERTEBRAL ARTERIES: Left dominant configuration. Both origins are clearly patent. There is no dissection, occlusion or flow-limiting stenosis to the skull base (V1-V3 segments). CTA HEAD FINDINGS POSTERIOR CIRCULATION: --Vertebral arteries: Normal V4 segments. --Inferior cerebellar arteries: Normal. --Basilar artery: Normal. --Superior cerebellar arteries: Normal. --Posterior cerebral arteries (PCA): Normal. The right PCA is partially supplied by a posterior communicating artery (p-comm). ANTERIOR CIRCULATION: --Intracranial internal carotid arteries: Normal. --  Anterior cerebral arteries (ACA): Normal. Both A1 segments are present. Patent anterior communicating artery (a-comm). --Middle cerebral arteries (MCA): Normal. VENOUS SINUSES: As permitted by contrast timing, patent. ANATOMIC VARIANTS: None Review of the MIP images confirms the above findings. IMPRESSION: 1. Small area of acute to subacute ischemia within the right cerebellum. MRI recommended for further evaluation. 2. Normal CTA of the head and neck.  No cerebellar artery occlusion. Electronically Signed   By: Ulyses Jarred M.D.   On: 09/02/2019 23:34    Procedures Procedures (including critical care time)  Medications Ordered in ED Medications  sodium chloride 0.9 % bolus 1,000 mL (1,000 mLs Intravenous New Bag/Given 09/02/19 2225)  metoCLOPramide (REGLAN) injection 10 mg (10 mg Intravenous Given 09/02/19 2225)  iohexol (OMNIPAQUE) 350 MG/ML injection 100 mL (100 mLs Intravenous Contrast Given 09/02/19 2305)    ED Course  I have reviewed the triage vital signs and the nursing notes.  Pertinent labs & imaging results that were available  during my care of the patient were reviewed by me and considered in my medical decision making (see chart for details).    MDM Rules/Calculators/A&P                     Patient here for evaluation of dizziness, transient slurred speech that began at 10 AM. She does have some ataxia on attempting to stand but otherwise no focal abnormalities. CTA is negative for occlusion but is concerning for acute to subacute infarct in the right cerebellum. Discussed with Dr. Lorraine Lax, with neurology, who recommends admission to the hospital service for further workup for CVA. Discussed with patient findings of studies recommendation for admission and she is in agreement with treatment plan.  Final Clinical Impression(s) / ED Diagnoses Final diagnoses:  None    Rx / DC Orders ED Discharge Orders    None       Quintella Reichert, MD 09/03/19 0002

## 2019-09-03 ENCOUNTER — Encounter (HOSPITAL_BASED_OUTPATIENT_CLINIC_OR_DEPARTMENT_OTHER): Payer: Self-pay | Admitting: Internal Medicine

## 2019-09-03 DIAGNOSIS — R7989 Other specified abnormal findings of blood chemistry: Secondary | ICD-10-CM | POA: Diagnosis present

## 2019-09-03 DIAGNOSIS — D509 Iron deficiency anemia, unspecified: Secondary | ICD-10-CM | POA: Diagnosis present

## 2019-09-03 DIAGNOSIS — D75839 Thrombocytosis, unspecified: Secondary | ICD-10-CM | POA: Diagnosis present

## 2019-09-03 DIAGNOSIS — R55 Syncope and collapse: Secondary | ICD-10-CM | POA: Diagnosis present

## 2019-09-03 DIAGNOSIS — D473 Essential (hemorrhagic) thrombocythemia: Secondary | ICD-10-CM | POA: Diagnosis not present

## 2019-09-03 DIAGNOSIS — I6389 Other cerebral infarction: Secondary | ICD-10-CM | POA: Diagnosis not present

## 2019-09-03 DIAGNOSIS — Z8249 Family history of ischemic heart disease and other diseases of the circulatory system: Secondary | ICD-10-CM | POA: Diagnosis not present

## 2019-09-03 DIAGNOSIS — Z20822 Contact with and (suspected) exposure to covid-19: Secondary | ICD-10-CM | POA: Diagnosis present

## 2019-09-03 DIAGNOSIS — E785 Hyperlipidemia, unspecified: Secondary | ICD-10-CM | POA: Diagnosis present

## 2019-09-03 DIAGNOSIS — R29700 NIHSS score 0: Secondary | ICD-10-CM | POA: Diagnosis present

## 2019-09-03 DIAGNOSIS — I63441 Cerebral infarction due to embolism of right cerebellar artery: Secondary | ICD-10-CM | POA: Diagnosis present

## 2019-09-03 DIAGNOSIS — I63442 Cerebral infarction due to embolism of left cerebellar artery: Secondary | ICD-10-CM | POA: Diagnosis not present

## 2019-09-03 DIAGNOSIS — E7800 Pure hypercholesterolemia, unspecified: Secondary | ICD-10-CM | POA: Diagnosis not present

## 2019-09-03 DIAGNOSIS — I639 Cerebral infarction, unspecified: Secondary | ICD-10-CM

## 2019-09-03 DIAGNOSIS — I63541 Cerebral infarction due to unspecified occlusion or stenosis of right cerebellar artery: Secondary | ICD-10-CM

## 2019-09-03 DIAGNOSIS — E876 Hypokalemia: Secondary | ICD-10-CM | POA: Diagnosis not present

## 2019-09-03 DIAGNOSIS — R4781 Slurred speech: Secondary | ICD-10-CM | POA: Diagnosis present

## 2019-09-03 DIAGNOSIS — Z833 Family history of diabetes mellitus: Secondary | ICD-10-CM | POA: Diagnosis not present

## 2019-09-03 LAB — CBC WITH DIFFERENTIAL/PLATELET
Abs Immature Granulocytes: 0.01 10*3/uL (ref 0.00–0.07)
Basophils Absolute: 0 10*3/uL (ref 0.0–0.1)
Basophils Relative: 1 %
Eosinophils Absolute: 0.1 10*3/uL (ref 0.0–0.5)
Eosinophils Relative: 1 %
HCT: 30.6 % — ABNORMAL LOW (ref 36.0–46.0)
Hemoglobin: 9.6 g/dL — ABNORMAL LOW (ref 12.0–15.0)
Immature Granulocytes: 0 %
Lymphocytes Relative: 34 %
Lymphs Abs: 2 10*3/uL (ref 0.7–4.0)
MCH: 23.5 pg — ABNORMAL LOW (ref 26.0–34.0)
MCHC: 31.4 g/dL (ref 30.0–36.0)
MCV: 75 fL — ABNORMAL LOW (ref 80.0–100.0)
Monocytes Absolute: 0.6 10*3/uL (ref 0.1–1.0)
Monocytes Relative: 10 %
Neutro Abs: 3.2 10*3/uL (ref 1.7–7.7)
Neutrophils Relative %: 54 %
Platelets: 512 10*3/uL — ABNORMAL HIGH (ref 150–400)
RBC: 4.08 MIL/uL (ref 3.87–5.11)
RDW: 14.9 % (ref 11.5–15.5)
WBC: 5.8 10*3/uL (ref 4.0–10.5)
nRBC: 0 % (ref 0.0–0.2)

## 2019-09-03 LAB — VITAMIN B12: Vitamin B-12: 253 pg/mL (ref 180–914)

## 2019-09-03 LAB — RAPID URINE DRUG SCREEN, HOSP PERFORMED
Amphetamines: NOT DETECTED
Barbiturates: NOT DETECTED
Benzodiazepines: NOT DETECTED
Cocaine: NOT DETECTED
Opiates: NOT DETECTED
Tetrahydrocannabinol: NOT DETECTED

## 2019-09-03 LAB — IRON AND TIBC
Iron: 14 ug/dL — ABNORMAL LOW (ref 28–170)
Saturation Ratios: 3 % — ABNORMAL LOW (ref 10.4–31.8)
TIBC: 529 ug/dL — ABNORMAL HIGH (ref 250–450)
UIBC: 515 ug/dL

## 2019-09-03 LAB — SEDIMENTATION RATE: Sed Rate: 7 mm/hr (ref 0–22)

## 2019-09-03 LAB — ANTITHROMBIN III: AntiThromb III Func: 117 % (ref 75–120)

## 2019-09-03 LAB — FOLATE: Folate: 25.3 ng/mL (ref >5.9)

## 2019-09-03 LAB — C-REACTIVE PROTEIN: CRP: 0.5 mg/dL (ref <1.0)

## 2019-09-03 LAB — CBG MONITORING, ED
Glucose-Capillary: 101 mg/dL — ABNORMAL HIGH (ref 70–99)
Glucose-Capillary: 115 mg/dL — ABNORMAL HIGH (ref 70–99)
Glucose-Capillary: 115 mg/dL — ABNORMAL HIGH (ref 70–99)

## 2019-09-03 LAB — HEMOGLOBIN A1C
Hgb A1c MFr Bld: 5.6 % (ref 4.8–5.6)
Mean Plasma Glucose: 114.02 mg/dL

## 2019-09-03 LAB — SARS CORONAVIRUS 2 BY RT PCR (HOSPITAL ORDER, PERFORMED IN ~~LOC~~ HOSPITAL LAB): SARS Coronavirus 2: NEGATIVE

## 2019-09-03 MED ORDER — ASPIRIN 325 MG PO TABS
325.0000 mg | ORAL_TABLET | Freq: Every day | ORAL | Status: DC
  Administered 2019-09-03: 325 mg via ORAL
  Filled 2019-09-03: qty 1

## 2019-09-03 MED ORDER — ACETAMINOPHEN 160 MG/5ML PO SOLN: 650.0000 mg | ORAL | Status: DC | PRN

## 2019-09-03 MED ORDER — ONDANSETRON HCL 4 MG/2ML IJ SOLN: 4.0000 mg | Freq: Four times a day (QID) | INTRAMUSCULAR | Status: DC | PRN

## 2019-09-03 MED ORDER — ACETAMINOPHEN 325 MG PO TABS
650.0000 mg | ORAL_TABLET | ORAL | Status: DC | PRN
  Administered 2019-09-04: 650 mg via ORAL
  Filled 2019-09-03 (×2): qty 2

## 2019-09-03 MED ORDER — ATORVASTATIN CALCIUM 40 MG PO TABS
40.0000 mg | ORAL_TABLET | Freq: Every day | ORAL | Status: DC
  Administered 2019-09-03: 40 mg via ORAL
  Filled 2019-09-03: qty 1

## 2019-09-03 MED ORDER — SENNOSIDES-DOCUSATE SODIUM 8.6-50 MG PO TABS: 2.0000 | ORAL_TABLET | Freq: Every evening | ORAL | Status: DC | PRN

## 2019-09-03 MED ORDER — ACETAMINOPHEN 650 MG RE SUPP: 650.0000 mg | RECTAL | Status: DC | PRN

## 2019-09-03 MED ORDER — STROKE: EARLY STAGES OF RECOVERY BOOK
Freq: Once | Status: AC
  Administered 2019-09-03: 1
  Filled 2019-09-03: qty 1

## 2019-09-03 NOTE — H&P (Signed)
History and Physical    Autumn Haney [id number] DOB: 1980/05/16 DOA: 09/02/2019  PCP: Patient, No Pcp Per  Patient coming from: Home   Chief Complaint:   Vertigo, slurred speech  HPI:  39 year old female with no past medical history who presents to the telemetry unit at [facility] as a transfer from [facility] after presenting with slurred speech and vertigo.  Patient explains the morning of 5/15 at approximately 9:30 in the morning she began to experience sudden onset of intense vertigo.  This intense vertigo was associated with slurring of her speech.  Patient also noticed significant difficulty in attempting to ambulate due to loss of balance.  Shortly after the onset of the symptoms, patient also began to experience a moderate headache, throbbing in quality located in the frontal region that radiated toward the occipital region.  The wife called her husband who witnessed the majority of the symptoms.  Upon further questioning, patient denies fevers, sick contacts, nausea, vomiting, shortness of breath, chest pain, palpitations.  In the past several weeks to months, patient denies any joint swelling, change in bowel habits, blood in the stool or rashes.  Patient denies any family history of rheumatologic disease.  Patient symptoms continue to persist until approximately 6 PM that evening when she decided to present herself to [place] emergency department.  During patient's evaluation in [place] emergency department CT angiogram of the head neck revealed a small area of acute subacute ischemia in the right cerebellum that was otherwise unremarkable.  Case was discussed with Dr. Lorraine Lax with allergy by the emergency department provider who recommended transfer to [facility] for further work-up.  The hospitalist group was then called to assess the patient as an incoming transfer to the general medical floor.  Of note, patient  symptoms completely resolved and patient returned to baseline close to midnight.   Review of Systems: A 10-system review of systems has been performed and all systems are negative with the exception of what is listed in the HPI.    Past Medical History:  Diagnosis Date  . Headache    rare migraines  . Hx of varicella   . Normal pregnancy 10/25/2010    Past Surgical History:  Procedure Laterality Date  . DILATION AND CURETTAGE OF UTERUS    . NASAL FRACTURE SURGERY       reports that she has never smoked. She has never used smokeless tobacco. She reports that she does not drink alcohol or use drugs.  No Known Allergies  Family History  Problem Relation Age of Onset  . Hypertension Father   . Diabetes Paternal Grandfather      Prior to Admission medications   Medication Sig Start Date End Date Taking? Authorizing Provider  naproxen sodium (ALEVE) 220 MG tablet Take 440 mg by mouth 2 (two) times daily as needed (headache).   Yes [provider]    Physical Exam: Vitals:   09/03/19 1155 09/03/19 1639 09/03/19 1808 09/03/19 1937  BP: 128/81 124/86 129/87 130/89  Pulse: 91 90 82 80  Resp: 16 20 16 18   Temp:  98.7 F (37.1 C) 99.3 F (37.4 C) 98.5 F (36.9 C)  TempSrc:  Oral Oral Oral  SpO2: 100% 99% 100% 100%  Weight:      Height:        Constitutional: Acute alert and oriented x3, no associated distress.   Skin: no rashes, no lesions, good skin turgor noted.  Eyes: Pupils are equally reactive to light.  No evidence of scleral icterus or conjunctival pallor.  ENMT: Moist mucous membranes noted.  Posterior pharynx clear of any exudate or lesions.   Neck: normal, supple, no masses, no thyromegaly.  No evidence of jugular venous distension.   Respiratory: clear to auscultation bilaterally, no wheezing, no crackles. Normal respiratory effort. No accessory muscle use.  Cardiovascular: Regular rate and rhythm, no murmurs / rubs / gallops. No extremity edema. 2+  pedal pulses. No carotid bruits.  Chest:   Nontender without crepitus or deformity.   Back:   Nontender without crepitus or deformity. Abdomen: Abdomen is soft and nontender.  No evidence of intra-abdominal masses.  Positive bowel sounds noted in all quadrants.   Musculoskeletal: No joint deformity upper and lower extremities. Good ROM, no contractures. Normal muscle tone.  Neurologic: CN 2-12 grossly intact. Sensation intact, strength noted to be 5 out of 5 in all 4 extremities.  Patient is following all commands.  Patient is responsive to verbal stimuli.   Psychiatric: Patient presents as a normal mood with appropriate affect.  Patient seems to possess insight as to theircurrent situation.     Labs on Admission: I have personally reviewed following labs and imaging studies -   CBC: Recent Labs  Lab 09/02/19 2122 09/03/19 2025  WBC 8.1 5.8  NEUTROABS  --  3.2  HGB 10.6* 9.6*  HCT 33.9* 30.6*  MCV 75.0* 75.0*  PLT 581* [id number]*   Basic Metabolic Panel: Recent Labs  Lab 09/02/19 2122  NA 135  K 3.9  CL 102  CO2 23  GLUCOSE 99  BUN 16  CREATININE 0.70  CALCIUM 9.0   GFR: Estimated Creatinine Clearance: 82.3 mL/min (by C-G formula based on SCr of 0.7 mg/dL). Liver Function Tests: No results for input(s): AST, ALT, ALKPHOS, BILITOT, PROT, ALBUMIN in the last 168 hours. No results for input(s): LIPASE, AMYLASE in the last 168 hours. No results for input(s): AMMONIA in the last 168 hours. Coagulation Profile: No results for input(s): INR, PROTIME in the last 168 hours. Cardiac Enzymes: No results for input(s): CKTOTAL, CKMB, CKMBINDEX, TROPONINI in the last 168 hours. BNP (last 3 results) No results for input(s): PROBNP in the last 8760 hours. HbA1C: No results for input(s): HGBA1C in the last 72 hours. CBG: Recent Labs  Lab 09/02/19 2215 09/02/19 2329 09/03/19 0340 09/03/19 0643 09/03/19 0859  GLUCAP 69* 104* 101* 115* 115*   Lipid Profile: No results for input(s):  CHOL, HDL, LDLCALC, TRIG, CHOLHDL, LDLDIRECT in the last 72 hours. Thyroid Function Tests: No results for input(s): TSH, T4TOTAL, FREET4, T3FREE, THYROIDAB in the last 72 hours. Anemia Panel: No results for input(s): VITAMINB12, FOLATE, FERRITIN, TIBC, IRON, RETICCTPCT in the last 72 hours. Urine analysis:    Component Value Date/Time   COLORURINE YELLOW 09/02/2019 1839   APPEARANCEUR CLEAR 09/02/2019 1839   LABSPEC 1.025 09/02/2019 1839   PHURINE 6.0 09/02/2019 1839   GLUCOSEU NEGATIVE 09/02/2019 1839   HGBUR NEGATIVE 09/02/2019 1839   BILIRUBINUR NEGATIVE 09/02/2019 1839   KETONESUR NEGATIVE 09/02/2019 1839   PROTEINUR NEGATIVE 09/02/2019 1839   NITRITE NEGATIVE 09/02/2019 1839   LEUKOCYTESUR NEGATIVE 09/02/2019 1839    Radiological Exams on Admission - Personally Reviewed: CT Angio Head W or Wo Contrast  Result Date: 09/02/2019 CLINICAL DATA:  Headache with neck pain and dizziness. EXAM: CT ANGIOGRAPHY HEAD AND NECK TECHNIQUE: Multidetector CT imaging of the head and neck was performed using the standard protocol during bolus administration  of intravenous contrast. Multiplanar CT image reconstructions and MIPs were obtained to evaluate the vascular anatomy. Carotid stenosis measurements (when applicable) are obtained utilizing NASCET criteria, using the distal internal carotid diameter as the denominator. CONTRAST:  179mL OMNIPAQUE IOHEXOL 350 MG/ML SOLN COMPARISON:  None. FINDINGS: CT HEAD FINDINGS Brain: There is no mass, hemorrhage or extra-axial collection. The size and configuration of the ventricles and extra-axial CSF spaces are normal. There is a small area of acute to subacute ischemia within the right cerebellum. Brain parenchyma is otherwise normal. Skull: The visualized skull base, calvarium and extracranial soft tissues are normal. Sinuses/Orbits: No fluid levels or advanced mucosal thickening of the visualized paranasal sinuses. No mastoid or middle ear effusion. The orbits  are normal. CTA NECK FINDINGS SKELETON: There is no bony spinal canal stenosis. No lytic or blastic lesion. OTHER NECK: Normal pharynx, larynx and major salivary glands. No cervical lymphadenopathy. Unremarkable thyroid gland. UPPER CHEST: No pneumothorax or pleural effusion. No nodules or masses. AORTIC ARCH: There is no calcific atherosclerosis of the aortic arch. There is no aneurysm, dissection or hemodynamically significant stenosis of the visualized portion of the aorta. Conventional 3 vessel aortic branching pattern. The visualized proximal subclavian arteries are widely patent. RIGHT CAROTID SYSTEM: Normal without aneurysm, dissection or stenosis. LEFT CAROTID SYSTEM: Normal without aneurysm, dissection or stenosis. VERTEBRAL ARTERIES: Left dominant configuration. Both origins are clearly patent. There is no dissection, occlusion or flow-limiting stenosis to the skull base (V1-V3 segments). CTA HEAD FINDINGS POSTERIOR CIRCULATION: --Vertebral arteries: Normal V4 segments. --Inferior cerebellar arteries: Normal. --Basilar artery: Normal. --Superior cerebellar arteries: Normal. --Posterior cerebral arteries (PCA): Normal. The right PCA is partially supplied by a posterior communicating artery (p-comm). ANTERIOR CIRCULATION: --Intracranial internal carotid arteries: Normal. --Anterior cerebral arteries (ACA): Normal. Both A1 segments are present. Patent anterior communicating artery (a-comm). --Middle cerebral arteries (MCA): Normal. VENOUS SINUSES: As permitted by contrast timing, patent. ANATOMIC VARIANTS: None Review of the MIP images confirms the above findings. IMPRESSION: 1. Small area of acute to subacute ischemia within the right cerebellum. MRI recommended for further evaluation. 2. Normal CTA of the head and neck.  No cerebellar artery occlusion. Electronically Signed   By: Ulyses Jarred M.D.   On: 09/02/2019 23:34   CT Angio Neck W and/or Wo Contrast  Result Date: 09/02/2019 CLINICAL DATA:   Headache with neck pain and dizziness. EXAM: CT ANGIOGRAPHY HEAD AND NECK TECHNIQUE: Multidetector CT imaging of the head and neck was performed using the standard protocol during bolus administration of intravenous contrast. Multiplanar CT image reconstructions and MIPs were obtained to evaluate the vascular anatomy. Carotid stenosis measurements (when applicable) are obtained utilizing NASCET criteria, using the distal internal carotid diameter as the denominator. CONTRAST:  178mL OMNIPAQUE IOHEXOL 350 MG/ML SOLN COMPARISON:  None. FINDINGS: CT HEAD FINDINGS Brain: There is no mass, hemorrhage or extra-axial collection. The size and configuration of the ventricles and extra-axial CSF spaces are normal. There is a small area of acute to subacute ischemia within the right cerebellum. Brain parenchyma is otherwise normal. Skull: The visualized skull base, calvarium and extracranial soft tissues are normal. Sinuses/Orbits: No fluid levels or advanced mucosal thickening of the visualized paranasal sinuses. No mastoid or middle ear effusion. The orbits are normal. CTA NECK FINDINGS SKELETON: There is no bony spinal canal stenosis. No lytic or blastic lesion. OTHER NECK: Normal pharynx, larynx and major salivary glands. No cervical lymphadenopathy. Unremarkable thyroid gland. UPPER CHEST: No pneumothorax or pleural effusion. No nodules or masses. AORTIC ARCH: There  is no calcific atherosclerosis of the aortic arch. There is no aneurysm, dissection or hemodynamically significant stenosis of the visualized portion of the aorta. Conventional 3 vessel aortic branching pattern. The visualized proximal subclavian arteries are widely patent. RIGHT CAROTID SYSTEM: Normal without aneurysm, dissection or stenosis. LEFT CAROTID SYSTEM: Normal without aneurysm, dissection or stenosis. VERTEBRAL ARTERIES: Left dominant configuration. Both origins are clearly patent. There is no dissection, occlusion or flow-limiting stenosis to the  skull base (V1-V3 segments). CTA HEAD FINDINGS POSTERIOR CIRCULATION: --Vertebral arteries: Normal V4 segments. --Inferior cerebellar arteries: Normal. --Basilar artery: Normal. --Superior cerebellar arteries: Normal. --Posterior cerebral arteries (PCA): Normal. The right PCA is partially supplied by a posterior communicating artery (p-comm). ANTERIOR CIRCULATION: --Intracranial internal carotid arteries: Normal. --Anterior cerebral arteries (ACA): Normal. Both A1 segments are present. Patent anterior communicating artery (a-comm). --Middle cerebral arteries (MCA): Normal. VENOUS SINUSES: As permitted by contrast timing, patent. ANATOMIC VARIANTS: None Review of the MIP images confirms the above findings. IMPRESSION: 1. Small area of acute to subacute ischemia within the right cerebellum. MRI recommended for further evaluation. 2. Normal CTA of the head and neck.  No cerebellar artery occlusion. Electronically Signed   By: Ulyses Jarred M.D.   On: 09/02/2019 23:34    EKG: Personally reviewed.  Rhythm is normal sinus rhythm with heart rate of 96 bpm.  No dynamic ST segment changes appreciated.  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  It is reassuring that patient symptoms have essentially resolved within 24 hours.  MRI brain without contrast is required MRI to confirm presence and severity of stroke.  Echocardiogram with both contrast and bubble study have been ordered for the morning.  Performing serial neurologic checks  Permissive hypertension  PT OT and SLP evaluations ordered.  Considering patient's lack of risk factors and presence of thrombocytosis, performing rheumatologic work-up and hypercoagulable state work-up.  Patient is  COVID-19 negative.  Patient received iron vaccine with both doses in March.  I discussed the case with Dr. Lorraine Lax with allergy who will evaluate patient this evening or tomorrow morning.  Aspirin and statin given.  Hemoglobin A1c and lipid panel pending.  Active Problems:   Thrombocytosis ([place])  See assessment and plan above    Microcytic anemia  Evidence of microcytic anemia on CBC  Iron panel, folate, vitamin B12 ordered.  No clinical evidence of bleeding  Monitoring hemoglobin and hematocrit with serial CBCs.    Code Status:  Full code Family Communication: Husband is at the bedside and has been updated on plan of care.  Status is: Inpatient  Remains inpatient appropriate because:Ongoing diagnostic testing needed not appropriate for outpatient work up and Inpatient level of care appropriate due to severity of illness   Dispo: The patient is from: Home              Anticipated d/c is to: Home              Anticipated d/c date is: 3 days              Patient currently is not medically stable to d/c.         Vernelle Emerald MD [facility] Pager [phone]  If 7PM-7AM, please contact night-coverage [email or web address] Use universal [place] password for that  web site. If you do not have the password, please call the hospital operator.  09/03/2019, 8:48 PM

## 2019-09-03 NOTE — Progress Notes (Signed)
Patient arrived to 3W05. A&O x4. Complains of a 3/10 headache. POC provided to patient. Call bell within reach.

## 2019-09-03 NOTE — Plan of Care (Signed)
39 yo F with slurred speech and presyncope, mild headache at 10 AM No prior health problems Slurred speech resolved In ER found to have BG 69 ECG showing some t-wave abnormalities CTA is negative for occlusion but is concerning for acute to subacute infarct in the right cerebellum. Discussed with Dr. Lorraine Lax, with neurology, who recommends admission to the hospital service for further workup for CVA. No respiratory symptoms COVID pending  Admit to tele bed will need MRI and CVA eval  pls let neurology know when pt arrives  Autumn Haney 12:51 AM

## 2019-09-03 NOTE — ED Notes (Signed)
Pt is aware that we are waiting for an inpatient bed. Assisted pt up to bathroom. Denied any dizziness. C/o of a general weakness. Sprite given per pt request.

## 2019-09-03 NOTE — ED Notes (Signed)
Leaving with [facility] at this time. 

## 2019-09-03 NOTE — Consult Note (Signed)
Requesting Physician: Dr. Cyd Silence    Chief Complaint: Dizziness, slurred speech  History obtained from: Patient and Chart    HPI:                                                                                                                                       Autumn Haney is a 39 y.o. female with past medical history of migraines, presents to the emergency department sudden onset dizziness, slurred speech and gait imbalance that occurred around 9:45 AM on 09/02/2019.  According to the patient, she went to the gym like she normally does and was feeling fine afterwards.  However around 9:45 PM she suddenly experienced vertigo, husband noticed that her speech was slurred which lasted for approximately 15 minutes patient went to lie in the bed, remember stumbling to get there.  After few hours see got up and noticed she was still having trouble walking and decided [facility]   CT head obtained in the ER revealed a right cerebellar infarct.  CT angiogram was negative for stenosis/dissection/occlusion.  Patient was admitted for further stroke work-up with the plan to transfer to [facility] only arrived to the ED almost 24 hours after presentation.  Work-up also revealed patient has thrombocytosis with platelet count 5 85,000.  She has no prior history of blood clots, leg pain.  History of 2 miscarriages.  No family history of autoimmune diseases or clotting disorders.   Date last known well: 5 15-21 Time last known well: 9:45 AM tPA Given: No, outside TPA window NIHSS: 0 Baseline MRS 0    Past Medical History:  Diagnosis Date  . Headache    rare migraines  . Hx of varicella   . Normal pregnancy 10/25/2010    Past Surgical History:  Procedure Laterality Date  . DILATION AND CURETTAGE OF UTERUS    . NASAL FRACTURE SURGERY      Family History  Problem Relation Age of Onset  . Hypertension Father   . Diabetes Paternal Grandfather     Social History:  reports that she has never smoked. She has never used smokeless tobacco. She reports that she does not drink alcohol or use drugs.  Allergies: No Known Allergies  Medications:  I reviewed home medications   ROS:                                                                                                                                     14 systems reviewed and negative except above    Examination:                                                                                                      General: Appears well-developed  Psych: Affect appropriate to situation Eyes: No scleral injection HENT: No OP obstrucion Head: Normocephalic.  Cardiovascular: Normal rate and regular rhythm. Respiratory: Effort normal and breath sounds normal to anterior ascultation GI: Soft.  No distension. There is no tenderness.  Skin: WDI    Neurological Examination Mental Status: Alert, oriented, thought content appropriate.  Speech fluent without evidence of aphasia. Able to follow 3 step commands without difficulty. Cranial Nerves: II: Visual fields grossly normal,  III,IV, VI: ptosis not present, extra-ocular motions intact bilaterally, pupils equal, round, reactive to light and accommodation V,VII: smile symmetric, facial light touch sensation normal bilaterally VIII: hearing normal bilaterally IX,X: uvula rises symmetrically XI: bilateral shoulder shrug XII: midline tongue extension Motor: Right : Upper extremity   5/5    Left:     Upper extremity   5/5  Lower extremity   5/5     Lower extremity   5/5 Tone and bulk:normal tone throughout; no atrophy noted Sensory: Pinprick and light touch intact throughout, bilaterally Deep Tendon Reflexes: 2+ and symmetric throughout Plantars: Right: downgoing   Left: downgoing Cerebellar: normal  finger-to-nose, normal rapid alternating movements and normal heel-to-shin test Gait: Not assessed     Lab Results: Basic Metabolic Panel: Recent Labs  Lab 09/02/19 2122  NA 135  K 3.9  CL 102  CO2 23  GLUCOSE 99  BUN 16  CREATININE 0.70  CALCIUM 9.0    CBC: Recent Labs  Lab 09/02/19 2122 09/03/19 2025  WBC 8.1 5.8  NEUTROABS  --  3.2  HGB 10.6* 9.6*  HCT 33.9* 30.6*  MCV 75.0* 75.0*  PLT 581* 512*    Coagulation Studies: No results for input(s): LABPROT, INR in the last 72 hours.  Imaging: CT Angio Head W or Wo Contrast  Result Date: 09/02/2019 CLINICAL DATA:  Headache with neck pain and dizziness. EXAM: CT ANGIOGRAPHY HEAD AND NECK TECHNIQUE: Multidetector CT imaging of the head and neck was performed using the standard protocol during bolus administration of intravenous contrast. Multiplanar  CT image reconstructions and MIPs were obtained to evaluate the vascular anatomy. Carotid stenosis measurements (when applicable) are obtained utilizing NASCET criteria, using the distal internal carotid diameter as the denominator. CONTRAST:  150mL OMNIPAQUE IOHEXOL 350 MG/ML SOLN COMPARISON:  None. FINDINGS: CT HEAD FINDINGS Brain: There is no mass, hemorrhage or extra-axial collection. The size and configuration of the ventricles and extra-axial CSF spaces are normal. There is a small area of acute to subacute ischemia within the right cerebellum. Brain parenchyma is otherwise normal. Skull: The visualized skull base, calvarium and extracranial soft tissues are normal. Sinuses/Orbits: No fluid levels or advanced mucosal thickening of the visualized paranasal sinuses. No mastoid or middle ear effusion. The orbits are normal. CTA NECK FINDINGS SKELETON: There is no bony spinal canal stenosis. No lytic or blastic lesion. OTHER NECK: Normal pharynx, larynx and major salivary glands. No cervical lymphadenopathy. Unremarkable thyroid gland. UPPER CHEST: No pneumothorax or pleural effusion.  No nodules or masses. AORTIC ARCH: There is no calcific atherosclerosis of the aortic arch. There is no aneurysm, dissection or hemodynamically significant stenosis of the visualized portion of the aorta. Conventional 3 vessel aortic branching pattern. The visualized proximal subclavian arteries are widely patent. RIGHT CAROTID SYSTEM: Normal without aneurysm, dissection or stenosis. LEFT CAROTID SYSTEM: Normal without aneurysm, dissection or stenosis. VERTEBRAL ARTERIES: Left dominant configuration. Both origins are clearly patent. There is no dissection, occlusion or flow-limiting stenosis to the skull base (V1-V3 segments). CTA HEAD FINDINGS POSTERIOR CIRCULATION: --Vertebral arteries: Normal V4 segments. --Inferior cerebellar arteries: Normal. --Basilar artery: Normal. --Superior cerebellar arteries: Normal. --Posterior cerebral arteries (PCA): Normal. The right PCA is partially supplied by a posterior communicating artery (p-comm). ANTERIOR CIRCULATION: --Intracranial internal carotid arteries: Normal. --Anterior cerebral arteries (ACA): Normal. Both A1 segments are present. Patent anterior communicating artery (a-comm). --Middle cerebral arteries (MCA): Normal. VENOUS SINUSES: As permitted by contrast timing, patent. ANATOMIC VARIANTS: None Review of the MIP images confirms the above findings. IMPRESSION: 1. Small area of acute to subacute ischemia within the right cerebellum. MRI recommended for further evaluation. 2. Normal CTA of the head and neck.  No cerebellar artery occlusion. Electronically Signed   By: Ulyses Jarred M.D.   On: 09/02/2019 23:34   CT Angio Neck W and/or Wo Contrast  Result Date: 09/02/2019 CLINICAL DATA:  Headache with neck pain and dizziness. EXAM: CT ANGIOGRAPHY HEAD AND NECK TECHNIQUE: Multidetector CT imaging of the head and neck was performed using the standard protocol during bolus administration of intravenous contrast. Multiplanar CT image reconstructions and MIPs were  obtained to evaluate the vascular anatomy. Carotid stenosis measurements (when applicable) are obtained utilizing NASCET criteria, using the distal internal carotid diameter as the denominator. CONTRAST:  1104mL OMNIPAQUE IOHEXOL 350 MG/ML SOLN COMPARISON:  None. FINDINGS: CT HEAD FINDINGS Brain: There is no mass, hemorrhage or extra-axial collection. The size and configuration of the ventricles and extra-axial CSF spaces are normal. There is a small area of acute to subacute ischemia within the right cerebellum. Brain parenchyma is otherwise normal. Skull: The visualized skull base, calvarium and extracranial soft tissues are normal. Sinuses/Orbits: No fluid levels or advanced mucosal thickening of the visualized paranasal sinuses. No mastoid or middle ear effusion. The orbits are normal. CTA NECK FINDINGS SKELETON: There is no bony spinal canal stenosis. No lytic or blastic lesion. OTHER NECK: Normal pharynx, larynx and major salivary glands. No cervical lymphadenopathy. Unremarkable thyroid gland. UPPER CHEST: No pneumothorax or pleural effusion. No nodules or masses. AORTIC ARCH: There is no calcific atherosclerosis  of the aortic arch. There is no aneurysm, dissection or hemodynamically significant stenosis of the visualized portion of the aorta. Conventional 3 vessel aortic branching pattern. The visualized proximal subclavian arteries are widely patent. RIGHT CAROTID SYSTEM: Normal without aneurysm, dissection or stenosis. LEFT CAROTID SYSTEM: Normal without aneurysm, dissection or stenosis. VERTEBRAL ARTERIES: Left dominant configuration. Both origins are clearly patent. There is no dissection, occlusion or flow-limiting stenosis to the skull base (V1-V3 segments). CTA HEAD FINDINGS POSTERIOR CIRCULATION: --Vertebral arteries: Normal V4 segments. --Inferior cerebellar arteries: Normal. --Basilar artery: Normal. --Superior cerebellar arteries: Normal. --Posterior cerebral arteries (PCA): Normal. The right PCA  is partially supplied by a posterior communicating artery (p-comm). ANTERIOR CIRCULATION: --Intracranial internal carotid arteries: Normal. --Anterior cerebral arteries (ACA): Normal. Both A1 segments are present. Patent anterior communicating artery (a-comm). --Middle cerebral arteries (MCA): Normal. VENOUS SINUSES: As permitted by contrast timing, patent. ANATOMIC VARIANTS: None Review of the MIP images confirms the above findings. IMPRESSION: 1. Small area of acute to subacute ischemia within the right cerebellum. MRI recommended for further evaluation. 2. Normal CTA of the head and neck.  No cerebellar artery occlusion. Electronically Signed   By: Ulyses Jarred M.D.   On: 09/02/2019 23:34     ASSESSMENT AND PLAN  39 y.o. female with past medical history of migraines, presents to the emergency department sudden onset dizziness, slurred speech and gait imbalance that occurred around 9:45 AM on 09/02/2019-noted to have acute right cerebellar infarct on CT head  Acute right cerebellar infarction Thrombocytosis  Recommendations # MRI of the brain without contrast #Transthoracic Echo with bubble study, may consider TEE #Hypercoagulable panel, consider hematology consult for thrombocytosis # Start patient on ASA 325mg  daily #Start or continue Atorvastatin 80 mg/other high intensity statin # BP goal: Normotension # HBAIC and Lipid profile # Telemetry monitoring # Frequent neuro checks #  stroke swallow screen  Please page stroke NP  Or  PA  Or MD from 8am -4 pm  as this patient from this time will be  followed by the stroke.   You can look them up on [email or web address]  Password [facility]    Sushanth Aroor [facility] Pager Number [phone]

## 2019-09-03 NOTE — ED Notes (Signed)
In room to give patient update on bed status.  States I would like to talk to the doctor about other options.  To make provider aware when he is available.

## 2019-09-04 ENCOUNTER — Inpatient Hospital Stay (HOSPITAL_COMMUNITY): Payer: BC Managed Care – PPO

## 2019-09-04 DIAGNOSIS — I639 Cerebral infarction, unspecified: Secondary | ICD-10-CM

## 2019-09-04 DIAGNOSIS — E7800 Pure hypercholesterolemia, unspecified: Secondary | ICD-10-CM

## 2019-09-04 DIAGNOSIS — I63442 Cerebral infarction due to embolism of left cerebellar artery: Secondary | ICD-10-CM

## 2019-09-04 DIAGNOSIS — I6389 Other cerebral infarction: Secondary | ICD-10-CM

## 2019-09-04 LAB — LIPID PANEL
Cholesterol: 239 mg/dL — ABNORMAL HIGH (ref 0–200)
HDL: 52 mg/dL (ref >40)
LDL Cholesterol: 169 mg/dL — ABNORMAL HIGH (ref 0–99)
Total CHOL/HDL Ratio: 4.6 RATIO
Triglycerides: 90 mg/dL (ref <150)
VLDL: 18 mg/dL (ref 0–40)

## 2019-09-04 LAB — PROTIME-INR
INR: 1.1 (ref 0.8–1.2)
Prothrombin Time: 13.8 seconds (ref 11.4–15.2)

## 2019-09-04 LAB — APTT: aPTT: 27 seconds (ref 24–36)

## 2019-09-04 LAB — HCG, QUANTITATIVE, PREGNANCY: hCG, Beta Chain, Quant, S: <1 m[IU]/mL (ref <5)

## 2019-09-04 LAB — HIV ANTIBODY (ROUTINE TESTING W REFLEX): HIV Screen 4th Generation wRfx: NONREACTIVE

## 2019-09-04 MED ORDER — ATORVASTATIN CALCIUM 80 MG PO TABS
80.0000 mg | ORAL_TABLET | Freq: Every day | ORAL | Status: DC
  Administered 2019-09-04 – 2019-09-05 (×2): 80 mg via ORAL
  Filled 2019-09-04 (×2): qty 1

## 2019-09-04 MED ORDER — ASPIRIN EC 81 MG PO TBEC
81.0000 mg | DELAYED_RELEASE_TABLET | Freq: Every day | ORAL | Status: DC
  Administered 2019-09-04 – 2019-09-05 (×2): 81 mg via ORAL
  Filled 2019-09-04 (×2): qty 1

## 2019-09-04 MED ORDER — FERROUS SULFATE 325 (65 FE) MG PO TABS
325.0000 mg | ORAL_TABLET | Freq: Every day | ORAL | Status: DC
  Filled 2019-09-04: qty 1

## 2019-09-04 NOTE — Progress Notes (Signed)
PROGRESS NOTE  Autumn Haney [id number] DOB: 1980/06/03 DOA: 09/02/2019 PCP: Patient, No Pcp Per  HPI/Recap of past 54 hours: 39 year old female with no past medical history who presents to the telemetry unit at [facility] as a transfer from [facility] after presenting with slurred speech and vertigo.  Per the patient 09/02/19 around 9:30 AM she began to experience sudden onset of intense vertigo associated with slurring of her speech.  Also noted significant difficulty in attempting to ambulate due to loss of balance, moderate headache, throbbing in quality located in the frontal region that radiated toward the occipital region.  She called her husband who witnessed the majority of the symptoms.  Symptoms persist until approximately 6 PM that on 09/02/19 when she decided to present to [place] ED for further evaluation.   CT angiogram of the head neck showed a small area of acute subacute ischemia in the right cerebellum.  Case was discussed with Dr. Lorraine Lax who recommended transfer to [facility] for further stroke work-up.   TRH asked to admit.  Of note, patient symptoms completely resolved and patient returned to baseline close to midnight.  Seen by neurology/stroke team.  Ongoing stroke work up.  09/04/19: Seen and examined.  Reports her symptoms have improved.  Denies dizziness upon ambulation for brain MRI.  Speech is fluent.  2D echo completed and results are pending.     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 Echo completed, results are pending.  Possible TEE and LP tomorrow, if negative may consider implantable loop recorder?  Will defer to neurology since low risk of A. fib in the  younger population. In sinus rhythm on twelve-lead EKG No history of tobacco abuse, no diabetes, no history of OSA, not on birth control. No significant atherosclerosis Bilateral lower extremity ultrasound negative for DVT Hypercoagulable work-up per neurology LDL 169, goal less than 70 A1c 5.6 with goal of less than 7.0 Not previously on aspirin Currently on 81 mg daily of aspirin ( since possible LP) high intensity statin, Lipitor 80 mg daily OT assessed, no OT follow-up recommended. Denies dysphagia, pending speech and PT assessment Continue frequent neuro checks, permissive hypertension, fall precautions  Hyperlipidemia LDL 169, goal less than 70 Continue high intensity statin, Lipitor 80 mg daily  Thrombocytosis Platelet count >500 Continue antiplatelet, on aspirin 81 mg daily Continue to monitor  Iron deficiency anemia Hemoglobin 9.6 with MCV 75 Presented with hemoglobin of 10.6 Iron studies suggestive of iron deficiency Start ferrous sulfate 325 mg daily       Code Status: Full code  Family Communication: None at bedside, will call if okay with the patient.     Consultants:  Neurology  Cardiology for TEE  Procedures:  None  Antimicrobials:  None  DVT prophylaxis: SCDs  Status is: Inpatient  Dispo: The patient is from: Home.               Anticipated d/c is to: Home.               Anticipated d/c date is:09/05/19 when stroke work-up is completed               Patient currently not appropriate for discharge due to ongoing stroke work-up.  Objective: Vitals:   09/03/19 2349 09/04/19 0355 09/04/19 0807 09/04/19 1200  BP: 120/72 129/87 119/74 115/77  Pulse: 77 69 81 91  Resp: 18 17 12 14   Temp:  98.7 F (37.1 C) 98.2 F (36.8 C) 98.6 F (37 C)  TempSrc: Oral Oral Oral Oral  SpO2: 98% 100% 99% 98%  Weight:      Height:       No intake or output data in the 24 hours ending 09/04/19 1243 Filed Weights   09/02/19 1837    Weight: 61.2 kg    Exam:  . General: 39 y.o. year-old female well developed well nourished in no acute distress.  Alert and oriented x3. . Cardiovascular: Regular rate and rhythm with no rubs or gallops.  No thyromegaly or JVD noted.   Marland Kitchen Respiratory: Clear to auscultation with no wheezes or rales. Good inspiratory effort. . Abdomen: Soft nontender nondistended with normal bowel sounds x4 quadrants. . Musculoskeletal: No lower extremity edema. 2/4 pulses in all 4 extremities. Marland Kitchen Psychiatry: Mood is appropriate for condition and setting   Data Reviewed: CBC: Recent Labs  Lab 09/02/19 2122 09/03/19 2025  WBC 8.1 5.8  NEUTROABS  --  3.2  HGB 10.6* 9.6*  HCT 33.9* 30.6*  MCV 75.0* 75.0*  PLT 581* [id number]*   Basic Metabolic Panel: Recent Labs  Lab 09/02/19 2122  NA 135  K 3.9  CL 102  CO2 23  GLUCOSE 99  BUN 16  CREATININE 0.70  CALCIUM 9.0   GFR: Estimated Creatinine Clearance: 82.3 mL/min (by C-G formula based on SCr of 0.7 mg/dL). Liver Function Tests: No results for input(s): AST, ALT, ALKPHOS, BILITOT, PROT, ALBUMIN in the last 168 hours. No results for input(s): LIPASE, AMYLASE in the last 168 hours. No results for input(s): AMMONIA in the last 168 hours. Coagulation Profile: Recent Labs  Lab 09/04/19 0725  INR 1.1   Cardiac Enzymes: No results for input(s): CKTOTAL, CKMB, CKMBINDEX, TROPONINI in the last 168 hours. BNP (last 3 results) No results for input(s): PROBNP in the last 8760 hours. HbA1C: Recent Labs    09/03/19 2025  HGBA1C 5.6   CBG: Recent Labs  Lab 09/02/19 2215 09/02/19 2329 09/03/19 0340 09/03/19 0643 09/03/19 0859  GLUCAP 69* 104* 101* 115* 115*   Lipid Profile: Recent Labs    09/04/19 0349  CHOL 239*  HDL 52  LDLCALC 169*  TRIG 90  CHOLHDL 4.6   Thyroid Function Tests: No results for input(s): TSH, T4TOTAL, FREET4, T3FREE, THYROIDAB in the last 72 hours. Anemia Panel: Recent Labs    09/03/19 2025  VITAMINB12 253   FOLATE 25.3  TIBC 529*  IRON 14*   Urine analysis:    Component Value Date/Time   COLORURINE YELLOW 09/02/2019 1839   APPEARANCEUR CLEAR 09/02/2019 1839   LABSPEC 1.025 09/02/2019 1839   PHURINE 6.0 09/02/2019 1839   GLUCOSEU NEGATIVE 09/02/2019 1839   HGBUR NEGATIVE 09/02/2019 1839   BILIRUBINUR NEGATIVE 09/02/2019 1839   KETONESUR NEGATIVE 09/02/2019 1839   PROTEINUR NEGATIVE 09/02/2019 1839   NITRITE NEGATIVE 09/02/2019 1839   LEUKOCYTESUR NEGATIVE 09/02/2019 1839   Sepsis Labs: @LABRCNTIP (procalcitonin:4,lacticidven:4)  ) Recent Results (from the past 240 hour(s))  SARS Coronavirus 2 by RT PCR (hospital order, performed in [place] hospital lab) Nasopharyngeal Nasopharyngeal Swab     Status: None   Collection Time: 09/03/19 12:32 AM   Specimen: Nasopharyngeal Swab  Result Value Ref Range Status   SARS Coronavirus 2 NEGATIVE NEGATIVE Final  Comment: (NOTE) SARS-CoV-2 target nucleic acids are NOT DETECTED. The SARS-CoV-2 RNA is generally detectable in upper and lower respiratory specimens during the acute phase of infection. The lowest concentration of SARS-CoV-2 viral copies this assay can detect is 250 copies / mL. A negative result does not preclude SARS-CoV-2 infection and should not be used as the sole basis for treatment or other patient management decisions.  A negative result may occur with improper specimen collection / handling, submission of specimen other than nasopharyngeal swab, presence of viral mutation(s) within the areas targeted by this assay, and inadequate number of viral copies (<250 copies / mL). A negative result must be combined with clinical observations, patient history, and epidemiological information. Fact Sheet for Patients:   StrictlyIdeas.no Fact Sheet for Healthcare Providers: BankingDealers.co.za This test is not yet approved or cleared  by the [place] FDA and has been  authorized for detection and/or diagnosis of SARS-CoV-2 by FDA under an Emergency Use Authorization (EUA).  This EUA will remain in effect (meaning this test can be used) for the duration of the COVID-19 declaration under Section 564(b)(1) of the Act, 21 U.S.C. section 360bbb-3(b)(1), unless the authorization is terminated or revoked sooner. Performed at [facility], [address] [place], [place] [address]       Studies: MR BRAIN WO CONTRAST  Result Date: 09/04/2019 CLINICAL DATA:  Sudden onset dizziness with slurred speech and gait disturbance EXAM: MRI HEAD WITHOUT CONTRAST TECHNIQUE: Multiplanar, multiecho pulse sequences of the brain and surrounding structures were obtained without intravenous contrast. COMPARISON:  CTA head neck 09/02/2019 FINDINGS: Brain: There are numerous foci of abnormal diffusion restriction throughout both cerebellar hemispheres and within the brainstem. There are a few scattered foci within the left PCA territory, including the left thalamus and occipital lobe. Cytotoxic edema at the infarct sites. No mass effect. Normal volume of CSF spaces. No chronic microhemorrhage. Normal midline structures. Vascular: Normal flow voids. Skull and upper cervical spine: Normal marrow signal. Sinuses/Orbits: Negative. Other: None. IMPRESSION: 1. Numerous foci of acute to early subacute ischemia throughout both cerebellar hemispheres, the brainstem and, to a lesser extent, the left PCA territory. 2. No mass effect or hemorrhage. Electronically Signed   By: Ulyses Jarred M.D.   On: 09/04/2019 03:54   VAS [place] LOWER EXTREMITY VENOUS (DVT)  Result Date: 09/04/2019  Lower Venous DVTStudy Indications: Stroke.  Risk Factors: None identified. Comparison Study: No prior studies. Performing Technologist: Oliver Hum RVT  Examination Guidelines: A complete evaluation includes B-mode imaging, spectral Doppler, color Doppler, and power Doppler as needed of all accessible portions  of each vessel. Bilateral testing is considered an integral part of a complete examination. Limited examinations for reoccurring indications may be performed as noted. The reflux portion of the exam is performed with the patient in reverse Trendelenburg.  +---------+---------------+---------+-----------+----------+--------------+ RIGHT    CompressibilityPhasicitySpontaneityPropertiesThrombus Aging +---------+---------------+---------+-----------+----------+--------------+ CFV      Full           Yes      Yes                                 +---------+---------------+---------+-----------+----------+--------------+ SFJ      Full                                                        +---------+---------------+---------+-----------+----------+--------------+  FV Prox  Full                                                        +---------+---------------+---------+-----------+----------+--------------+ FV Mid   Full                                                        +---------+---------------+---------+-----------+----------+--------------+ FV DistalFull                                                        +---------+---------------+---------+-----------+----------+--------------+ PFV      Full                                                        +---------+---------------+---------+-----------+----------+--------------+ POP      Full           Yes      Yes                                 +---------+---------------+---------+-----------+----------+--------------+ PTV      Full                                                        +---------+---------------+---------+-----------+----------+--------------+ PERO     Full                                                        +---------+---------------+---------+-----------+----------+--------------+   +---------+---------------+---------+-----------+----------+--------------+ LEFT      CompressibilityPhasicitySpontaneityPropertiesThrombus Aging +---------+---------------+---------+-----------+----------+--------------+ CFV      Full           Yes      Yes                                 +---------+---------------+---------+-----------+----------+--------------+ SFJ      Full                                                        +---------+---------------+---------+-----------+----------+--------------+ FV Prox  Full                                                        +---------+---------------+---------+-----------+----------+--------------+  FV Mid   Full                                                        +---------+---------------+---------+-----------+----------+--------------+ FV DistalFull                                                        +---------+---------------+---------+-----------+----------+--------------+ PFV      Full                                                        +---------+---------------+---------+-----------+----------+--------------+ POP      Full           Yes      Yes                                 +---------+---------------+---------+-----------+----------+--------------+ PTV      Full                                                        +---------+---------------+---------+-----------+----------+--------------+ PERO     Full                                                        +---------+---------------+---------+-----------+----------+--------------+     Summary: RIGHT: - There is no evidence of deep vein thrombosis in the lower extremity.  - No cystic structure found in the popliteal fossa.  LEFT: - There is no evidence of deep vein thrombosis in the lower extremity.  - No cystic structure found in the popliteal fossa.  *See table(s) above for measurements and observations.    Preliminary     Scheduled Meds: . aspirin EC  81 mg Oral Daily  . atorvastatin  80 mg Oral Daily     Continuous Infusions:   LOS: 1 day     Kayleen Memos, MD [facility] Pager [phone]  If 7PM-7AM, please contact night-coverage [email or web address] Password [facility] 09/04/2019, 12:43 PM

## 2019-09-04 NOTE — Evaluation (Addendum)
Occupational Therapy Evaluation and Discharge Patient Details Name: Autumn Haney MRN: [id number] DOB: 1981-02-25 Today's Date: 09/04/2019    History of Present Illness Pt is a 39 y/o female with PMH of headache presenting with dizziness, slurred speech, and gait imbalance. CT reveals R cerebellar infarct.    Clinical Impression   PTA patient independent, working, driving. Admitted for above and presenting at baseline independent level for ADLs, in room mobility. Patient demonstrates no deficits in cognition, balance, coordination, vision, or sensation; reports all symptoms have resolved. Discussed signs and symptoms of CVA. Pt verbalized understanding of education and recommendations.  No further OT services identified and OT will sign off. Thank you for this referral.     Follow Up Recommendations  No OT follow up    Equipment Recommendations  None recommended by OT    Recommendations for Other Services       Precautions / Restrictions Precautions Precautions: None Restrictions Weight Bearing Restrictions: No      Mobility Bed Mobility Overal bed mobility: Independent                Transfers Overall transfer level: Independent                    Balance Overall balance assessment: No apparent balance deficits (not formally assessed)                                         ADL either performed or assessed with clinical judgement   ADL Overall ADL's : Independent                                       General ADL Comments: pt demonstrates ability to complete toilet transfers, toileting, grooming and LB dressing with independence     Vision Baseline Vision/History: Wears glasses Wears Glasses: At all times Patient Visual Report: No change from baseline Vision Assessment?: No apparent visual deficits     Perception     Praxis      Pertinent Vitals/Pain Pain Assessment: No/denies pain     Hand Dominance  Right   Extremity/Trunk Assessment Upper Extremity Assessment Upper Extremity Assessment: Overall WFL for tasks assessed   Lower Extremity Assessment Lower Extremity Assessment: Defer to PT evaluation       Communication Communication Communication: No difficulties   Cognition Arousal/Alertness: Awake/alert Behavior During Therapy: WFL for tasks assessed/performed Overall Cognitive Status: Within Functional Limits for tasks assessed                                     General Comments  discussed signs and symptoms of strokes     Exercises     Shoulder Instructions      Home Living Family/patient expects to be discharged to:: Private residence Living Arrangements: Spouse/significant other;Other (Comment);Children(8 &4 ) Available Help at Discharge: Family;Available PRN/intermittently Type of Home: House Home Access: Stairs to enter [employer] of Steps: 3 Entrance Stairs-Rails: None Home Layout: Two level;Able to live on main level with bedroom/bathroom     Bathroom Shower/Tub: Teacher, early years/pre: Standard     Home Equipment: None          Prior Functioning/Environment Level of Independence: Independent  Comments: driving, working at [employer] List:        OT Treatment/Interventions:      OT Goals(Current goals can be found in the care plan section) Acute Rehab OT Goals Patient Stated Goal: home  OT Goal Formulation: With patient  OT Frequency:     Barriers to D/C:            Co-evaluation              AM-PAC OT "6 Clicks" Daily Activity     Outcome Measure Help from another person eating meals?: None Help from another person taking care of personal grooming?: None Help from another person toileting, which includes using toliet, bedpan, or urinal?: None Help from another person bathing (including washing, rinsing, drying)?: None Help from another person to put on and  taking off regular upper body clothing?: None Help from another person to put on and taking off regular lower body clothing?: None 6 Click Score: 24   End of Session Nurse Communication: Mobility status  Activity Tolerance: Patient tolerated treatment well Patient left: in bed;with call bell/phone within reach;with family/visitor present  OT Visit Diagnosis: Unsteadiness on feet (R26.81)                Time: [id number] OT Time Calculation (min): 10 min Charges:  OT General Charges $OT Visit: 1 Visit OT Evaluation $OT Eval Low Complexity: 1 Low  Jolaine Artist, OT Acute Rehabilitation Services Pager [phone] Office [phone]   Delight Stare 09/04/2019, 9:50 AM

## 2019-09-04 NOTE — Evaluation (Signed)
Speech Language Pathology Evaluation Patient Details Name: Autumn Haney MRN: [id number] DOB: 12/18/1980 Today's Date: 09/04/2019 Time: 1520-1550 SLP Time Calculation (min) (ACUTE ONLY): 30 min  Problem List:  Patient Active Problem List   Diagnosis Date Noted  . 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 Thrombocytosis ([place]) 09/03/2019  . Microcytic anemia 09/03/2019  . Pregnancy 11/07/2014  . SVD (spontaneous vaginal delivery) 11/07/2014   Past Medical History:  Past Medical History:  Diagnosis Date  . Headache    rare migraines  . Hx of varicella   . Normal pregnancy 10/25/2010   Past Surgical History:  Past Surgical History:  Procedure Laterality Date  . DILATION AND CURETTAGE OF UTERUS    . NASAL FRACTURE SURGERY     HPI:  39 yo female transferred from [facility] with headache from frontal lobe radiating to occipital region, speech deficits and difficulty ambulating.   Pt MRI showed Numerous foci of acute to early subacute ischemia throughout both cerebellar hemispheres, the brainstem and, to a lesser extent, the left PCA territory.  Speech evaluation ordered.  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   Assessment / Plan / Recommendation Clinical Impression  Administration of the Cerebellar Cognitive Affective Syndrome (CCAS) which evaluates neuropsychological deficits were consistently shown in the areas of executive functions, attention, learning and memory, language, and visuospatial functions.  Pt scored 98/120 or gross results 9/10 with area of difficulty mostly presenting as verbal fluency and to a lesser extent memory retrieval.  Pt without dysarthria, aphasia nor focal CN deficits.  SLP provided pt her score and information re: the test to allow her to research specific test as desired.  No SLP follow up indicated as pt reports resolution of stroke symptoms.    SLP  Assessment  SLP Recommendation/Assessment: Patient does not need any further Speech Lanaguage Pathology Services    Follow Up Recommendations  None    Frequency and Duration   n/a        SLP Evaluation Cognition  Overall Cognitive Status: Within Functional Limits for tasks assessed Arousal/Alertness: Awake/alert Orientation Level: Oriented X4 Attention: Sustained;Selective Memory: Impaired Memory Impairment: Retrieval deficit(recalled 3/5 words independently, 2/5 words with category cue) Awareness: Appears intact Problem Solving: Appears intact Safety/Judgment: Appears intact       Comprehension  Auditory Comprehension Overall Auditory Comprehension: Appears within functional limits for tasks assessed Yes/No Questions: Not tested Commands: Within Functional Limits Conversation: Complex Visual Recognition/Discrimination Discrimination: Not tested Reading Comprehension Reading Status: Not tested    Expression Expression Primary Mode of Expression: Verbal Verbal Expression Overall Verbal Expression: Appears within functional limits for tasks assessed Initiation: No impairment Level of Generative/Spontaneous Verbalization: Conversation Repetition: No impairment Naming: Not tested Pragmatics: No impairment Written Expression Dominant Hand: Right Written Expression: Not tested   Oral / Motor  Oral Motor/Sensory Function Overall Oral Motor/Sensory Function: Within functional limits Motor Speech Overall Motor Speech: Appears within functional limits for tasks assessed Respiration: Within functional limits Phonation: Normal Resonance: Within functional limits Articulation: Within functional limitis Intelligibility: Intelligible Motor Planning: Witnin functional limits   GO                    Macario Golds 09/04/2019, 6:26 PM  Kathleen Lime, MS [place] Office [phone]

## 2019-09-04 NOTE — TOC CAGE-AID Note (Signed)
Transition of Care [facility]) - CAGE-AID Screening   Patient Details  Name: Autumn Haney MRN: [id number] Date of Birth: 1980/12/04  Transition of Care [facility] [place]) CM/SW Contact:    Emeterio Reeve, [place] Phone Number: 09/04/2019, 3:14 PM   Clinical Narrative:  Pt stated she drinks 1-2 drinks  Of alcohol almost daily. Pt states She has a stressful job and small children and it is how she relaxes. Pt states she does not have a problem with her alcohol use. Pt was receptive to eduction around drinking. Pt denied resources.     CAGE-AID Screening:    Have You Ever Felt You Ought to Cut Down on Your Drinking or Drug Use?: No Have People Annoyed You By Critizing Your Drinking Or Drug Use?: Yes Have You Felt Bad Or Guilty About Your Drinking Or Drug Use?: No Have You Ever Had a Drink or Used Drugs First Thing In The Morning to STeady Your Nerves or to Get Rid of a Hangover?: No CAGE-AID Score: 1  Substance Abuse Education Offered: Yes  Substance abuse interventions: Educational Materials    Emeterio Reeve, Latanya Presser, [place] Social Worker [phone]

## 2019-09-04 NOTE — Progress Notes (Signed)
Bilateral lower extremity venous duplex has been completed. Preliminary results can be found in CV Proc through chart review.   09/04/19 9:14 AM Autumn Haney RVT

## 2019-09-04 NOTE — Progress Notes (Signed)
  Echocardiogram 2D Echocardiogram has been performed.  Autumn Haney 09/04/2019, 9:46 AM

## 2019-09-04 NOTE — H&P (View-Only) (Signed)
STROKE TEAM PROGRESS NOTE   INTERVAL HISTORY Husband at bedside. Pt sitting in bed stated that she is back to her baseline. She had vertigo and slurry speech, difficulty walking Saturday morning after gym exercise breakfast and shower. No HA or neck pain at that time, but developed back of head pain after the vertigo. The slurry speech lasted 31mn and vertigo difficulty walking resolved Saturday night. Sunday she still has some dull HA but now she is back to normal.   She denies any LOC or seizure. She denies any family hx of clotting disorder. She had migraine in her 89sbut no HA since. Denies pregnancy at this time, urine pregnancy neg. She admitted that her gym exercise is aggressive but denies much abrupt neck movement.  Vitals:   09/03/19 1937 09/03/19 2349 09/04/19 0355 09/04/19 0807  BP: 130/89 120/72 129/87 119/74  Pulse: 80 77 69 81  Resp: '18 18 17 12  '$ Temp: 98.5 F (36.9 C) 98.8 F (37.1 C) 98.7 F (37.1 C) 98.2 F (36.8 C)  TempSrc: Oral Oral Oral Oral  SpO2: 100% 98% 100% 99%  Weight:      Height:        CBC:  Recent Labs  Lab 09/02/19 2122 09/03/19 2025  WBC 8.1 5.8  NEUTROABS  --  3.2  HGB 10.6* 9.6*  HCT 33.9* 30.6*  MCV 75.0* 75.0*  PLT 581* 512*    Basic Metabolic Panel:  Recent Labs  Lab 09/02/19 2122  NA 135  K 3.9  CL 102  CO2 23  GLUCOSE 99  BUN 16  CREATININE 0.70  CALCIUM 9.0   Lipid Panel:     Component Value Date/Time   CHOL 239 (H) 09/04/2019 0349   TRIG 90 09/04/2019 0349   HDL 52 09/04/2019 0349   CHOLHDL 4.6 09/04/2019 0349   VLDL 18 09/04/2019 0349   LDLCALC 169 (H) 09/04/2019 0349   HgbA1c:  Lab Results  Component Value Date   HGBA1C 5.6 09/03/2019   Urine Drug Screen:     Component Value Date/Time   LABOPIA NONE DETECTED 09/02/2019 1839   COCAINSCRNUR NONE DETECTED 09/02/2019 1839   LABBENZ NONE DETECTED 09/02/2019 1839   AMPHETMU NONE DETECTED 09/02/2019 1839   THCU NONE DETECTED 09/02/2019 1839   LABBARB NONE  DETECTED 09/02/2019 1839    Alcohol Level No results found for: ETH  IMAGING past 24 hours MR BRAIN WO CONTRAST  Result Date: 09/04/2019 CLINICAL DATA:  Sudden onset dizziness with slurred speech and gait disturbance EXAM: MRI HEAD WITHOUT CONTRAST TECHNIQUE: Multiplanar, multiecho pulse sequences of the brain and surrounding structures were obtained without intravenous contrast. COMPARISON:  CTA head neck 09/02/2019 FINDINGS: Brain: There are numerous foci of abnormal diffusion restriction throughout both cerebellar hemispheres and within the brainstem. There are a few scattered foci within the left PCA territory, including the left thalamus and occipital lobe. Cytotoxic edema at the infarct sites. No mass effect. Normal volume of CSF spaces. No chronic microhemorrhage. Normal midline structures. Vascular: Normal flow voids. Skull and upper cervical spine: Normal marrow signal. Sinuses/Orbits: Negative. Other: None. IMPRESSION: 1. Numerous foci of acute to early subacute ischemia throughout both cerebellar hemispheres, the brainstem and, to a lesser extent, the left PCA territory. 2. No mass effect or hemorrhage. Electronically Signed   By: KUlyses JarredM.D.   On: 09/04/2019 03:54   VAS [place]OWER EXTREMITY VENOUS (DVT)  Result Date: 09/04/2019  Lower Venous DVTStudy Indications: Stroke.  Risk Factors: None identified. Comparison  Study: No prior studies. Performing Technologist: Oliver Hum RVT  Examination Guidelines: A complete evaluation includes B-mode imaging, spectral Doppler, color Doppler, and power Doppler as needed of all accessible portions of each vessel. Bilateral testing is considered an integral part of a complete examination. Limited examinations for reoccurring indications may be performed as noted. The reflux portion of the exam is performed with the patient in reverse Trendelenburg.  +---------+---------------+---------+-----------+----------+--------------+ RIGHT     CompressibilityPhasicitySpontaneityPropertiesThrombus Aging +---------+---------------+---------+-----------+----------+--------------+ CFV      Full           Yes      Yes                                 +---------+---------------+---------+-----------+----------+--------------+ SFJ      Full                                                        +---------+---------------+---------+-----------+----------+--------------+ FV Prox  Full                                                        +---------+---------------+---------+-----------+----------+--------------+ FV Mid   Full                                                        +---------+---------------+---------+-----------+----------+--------------+ FV DistalFull                                                        +---------+---------------+---------+-----------+----------+--------------+ PFV      Full                                                        +---------+---------------+---------+-----------+----------+--------------+ POP      Full           Yes      Yes                                 +---------+---------------+---------+-----------+----------+--------------+ PTV      Full                                                        +---------+---------------+---------+-----------+----------+--------------+ PERO     Full                                                        +---------+---------------+---------+-----------+----------+--------------+   +---------+---------------+---------+-----------+----------+--------------+  LEFT     CompressibilityPhasicitySpontaneityPropertiesThrombus Aging +---------+---------------+---------+-----------+----------+--------------+ CFV      Full           Yes      Yes                                 +---------+---------------+---------+-----------+----------+--------------+ SFJ      Full                                                         +---------+---------------+---------+-----------+----------+--------------+ FV Prox  Full                                                        +---------+---------------+---------+-----------+----------+--------------+ FV Mid   Full                                                        +---------+---------------+---------+-----------+----------+--------------+ FV DistalFull                                                        +---------+---------------+---------+-----------+----------+--------------+ PFV      Full                                                        +---------+---------------+---------+-----------+----------+--------------+ POP      Full           Yes      Yes                                 +---------+---------------+---------+-----------+----------+--------------+ PTV      Full                                                        +---------+---------------+---------+-----------+----------+--------------+ PERO     Full                                                        +---------+---------------+---------+-----------+----------+--------------+     Summary: RIGHT: - There is no evidence of deep vein thrombosis in the lower extremity.  - No cystic structure found in the popliteal fossa.  LEFT: - There is no evidence of deep vein thrombosis in the lower extremity.  - No  cystic structure found in the popliteal fossa.  *See table(s) above for measurements and observations.    Preliminary     PHYSICAL EXAM  Temp:  [98.2 F (36.8 C)-99.3 F (37.4 C)] 98.6 F (37 C) (05/17 1200) Pulse Rate:  [69-91] 91 (05/17 1200) Resp:  [12-20] 14 (05/17 1200) BP: (115-130)/(72-89) 115/77 (05/17 1200) SpO2:  [98 %-100 %] 98 % (05/17 1200)  General - Well nourished, well developed, in no apparent distress.  Ophthalmologic - fundi not visualized due to noncooperation.  Cardiovascular - Regular rhythm and rate.  Mental Status  -  Level of arousal and orientation to time, place, and person were intact. Language including expression, naming, repetition, comprehension was assessed and found intact. Attention span and concentration were normal. Fund of Knowledge was assessed and was intact.  Cranial Nerves II - XII - II - Visual field intact OU. III, IV, VI - Extraocular movements intact. V - Facial sensation intact bilaterally. VII - Facial movement intact bilaterally. VIII - Hearing & vestibular intact bilaterally X - Palate elevates symmetrically. XI - Chin turning & shoulder shrug intact bilaterally. XII - Tongue protrusion intact.  Motor Strength - The patient's strength was normal in all extremities and pronator drift was absent.  Bulk was normal and fasciculations were absent.   Motor Tone - Muscle tone was assessed at the neck and appendages and was normal.  Reflexes - The patient's reflexes were symmetrical in all extremities and she had no pathological reflexes.  Sensory - Light touch, temperature/pinprick were assessed and were symmetrical.    Coordination - The patient had normal movements in the hands and feet with no ataxia or dysmetria.  Tremor was absent.  Gait and Station - deferred.   ASSESSMENT/PLAN Ms. TASHAWN GREFF is a 39 y.o. female with history of migraines presenting with dizziness, slurred speech and gait imbalance.   Stroke:   Embolic shower at posterior circulation, infarcts embolic secondary to unknown source  CT head B cerebellar infarct   CTA head & neck Unremarkable   MRI  Scattered B cerebellar, brainstem and L PCA territory infarcts  LE Doppler  Neg DVT  2D Echo pending  TEE to look for embolic source. Arranged with [place] for tomorrow at 0800.  (I have made patient NPO after midnight tonight).  If above work up unrevealing, may consider LP, but very unlikely for CNS vasculitis given no clinical sign and CTA unremarkable   LDL  169  HgbA1c 5.6  ESR 7 and CRP 0.5  UDS neg  Hypercoagulable labs pending   SCDs for VTE prophylaxis  No antithrombotic prior to admission, now on aspirin 81 mg daily. No DAPT for now in case LP.   Therapy recommendations:  None  Disposition:  pending   Hyperlipidemia  Home meds:  No statin  Now on lipitor 80   LDL 169, goal < 70  Continue statin at discharge  Other Stroke Risk Factors  Migraines, rare  Thrombocytosis PLT 581->512    Hx miscarriages x 2  Other Active Problems  Microcytic anemia. Hb 10.6->9.6, Folate ok. Fe 14, TIBC 529, Sat ratio 3    Hospital day # 1   Rosalin Hawking, MD PhD Stroke Neurology 09/04/2019 1:12 PM   To contact Stroke Continuity provider, please refer to [email or web address] After hours, contact General Neurology

## 2019-09-04 NOTE — Progress Notes (Signed)
PT Cancellation Note  Patient Details Name: Autumn Haney MRN: [id number] DOB: 1980-08-14   Cancelled Treatment:    Reason Eval/Treat Not Completed: PT screened, no needs identified, will sign off  Spoke with OT and noted per Dr Phoebe Sharps note that patient has no remaining deficits in strength, coordination or balance.    Arby Barrette, PT Pager [phone]  [name] 09/04/2019, 3:41 PM

## 2019-09-04 NOTE — Progress Notes (Signed)
STROKE TEAM PROGRESS NOTE   INTERVAL HISTORY Husband at bedside. Pt sitting in bed stated that she is back to her baseline. She had vertigo and slurry speech, difficulty walking Saturday morning after gym exercise breakfast and shower. No HA or neck pain at that time, but developed back of head pain after the vertigo. The slurry speech lasted 30mn and vertigo difficulty walking resolved Saturday night. Sunday she still has some dull HA but now she is back to normal.   She denies any LOC or seizure. She denies any family hx of clotting disorder. She had migraine in her 89sbut no HA since. Denies pregnancy at this time, urine pregnancy neg. She admitted that her gym exercise is aggressive but denies much abrupt neck movement.  Vitals:   09/03/19 1937 09/03/19 2349 09/04/19 0355 09/04/19 0807  BP: 130/89 120/72 129/87 119/74  Pulse: 80 77 69 81  Resp: '18 18 17 12  '$ Temp: 98.5 F (36.9 C) 98.8 F (37.1 C) 98.7 F (37.1 C) 98.2 F (36.8 C)  TempSrc: Oral Oral Oral Oral  SpO2: 100% 98% 100% 99%  Weight:      Height:        CBC:  Recent Labs  Lab 09/02/19 2122 09/03/19 2025  WBC 8.1 5.8  NEUTROABS  --  3.2  HGB 10.6* 9.6*  HCT 33.9* 30.6*  MCV 75.0* 75.0*  PLT 581* 512*    Basic Metabolic Panel:  Recent Labs  Lab 09/02/19 2122  NA 135  K 3.9  CL 102  CO2 23  GLUCOSE 99  BUN 16  CREATININE 0.70  CALCIUM 9.0   Lipid Panel:     Component Value Date/Time   CHOL 239 (H) 09/04/2019 0349   TRIG 90 09/04/2019 0349   HDL 52 09/04/2019 0349   CHOLHDL 4.6 09/04/2019 0349   VLDL 18 09/04/2019 0349   LDLCALC 169 (H) 09/04/2019 0349   HgbA1c:  Lab Results  Component Value Date   HGBA1C 5.6 09/03/2019   Urine Drug Screen:     Component Value Date/Time   LABOPIA NONE DETECTED 09/02/2019 1839   COCAINSCRNUR NONE DETECTED 09/02/2019 1839   LABBENZ NONE DETECTED 09/02/2019 1839   AMPHETMU NONE DETECTED 09/02/2019 1839   THCU NONE DETECTED 09/02/2019 1839   LABBARB NONE  DETECTED 09/02/2019 1839    Alcohol Level No results found for: ETH  IMAGING past 24 hours MR BRAIN WO CONTRAST  Result Date: 09/04/2019 CLINICAL DATA:  Sudden onset dizziness with slurred speech and gait disturbance EXAM: MRI HEAD WITHOUT CONTRAST TECHNIQUE: Multiplanar, multiecho pulse sequences of the brain and surrounding structures were obtained without intravenous contrast. COMPARISON:  CTA head neck 09/02/2019 FINDINGS: Brain: There are numerous foci of abnormal diffusion restriction throughout both cerebellar hemispheres and within the brainstem. There are a few scattered foci within the left PCA territory, including the left thalamus and occipital lobe. Cytotoxic edema at the infarct sites. No mass effect. Normal volume of CSF spaces. No chronic microhemorrhage. Normal midline structures. Vascular: Normal flow voids. Skull and upper cervical spine: Normal marrow signal. Sinuses/Orbits: Negative. Other: None. IMPRESSION: 1. Numerous foci of acute to early subacute ischemia throughout both cerebellar hemispheres, the brainstem and, to a lesser extent, the left PCA territory. 2. No mass effect or hemorrhage. Electronically Signed   By: KUlyses JarredM.D.   On: 09/04/2019 03:54   VAS [place]OWER EXTREMITY VENOUS (DVT)  Result Date: 09/04/2019  Lower Venous DVTStudy Indications: Stroke.  Risk Factors: None identified. Comparison  Study: No prior studies. Performing Technologist: Oliver Hum RVT  Examination Guidelines: A complete evaluation includes B-mode imaging, spectral Doppler, color Doppler, and power Doppler as needed of all accessible portions of each vessel. Bilateral testing is considered an integral part of a complete examination. Limited examinations for reoccurring indications may be performed as noted. The reflux portion of the exam is performed with the patient in reverse Trendelenburg.  +---------+---------------+---------+-----------+----------+--------------+ RIGHT     CompressibilityPhasicitySpontaneityPropertiesThrombus Aging +---------+---------------+---------+-----------+----------+--------------+ CFV      Full           Yes      Yes                                 +---------+---------------+---------+-----------+----------+--------------+ SFJ      Full                                                        +---------+---------------+---------+-----------+----------+--------------+ FV Prox  Full                                                        +---------+---------------+---------+-----------+----------+--------------+ FV Mid   Full                                                        +---------+---------------+---------+-----------+----------+--------------+ FV DistalFull                                                        +---------+---------------+---------+-----------+----------+--------------+ PFV      Full                                                        +---------+---------------+---------+-----------+----------+--------------+ POP      Full           Yes      Yes                                 +---------+---------------+---------+-----------+----------+--------------+ PTV      Full                                                        +---------+---------------+---------+-----------+----------+--------------+ PERO     Full                                                        +---------+---------------+---------+-----------+----------+--------------+   +---------+---------------+---------+-----------+----------+--------------+  LEFT     CompressibilityPhasicitySpontaneityPropertiesThrombus Aging +---------+---------------+---------+-----------+----------+--------------+ CFV      Full           Yes      Yes                                 +---------+---------------+---------+-----------+----------+--------------+ SFJ      Full                                                         +---------+---------------+---------+-----------+----------+--------------+ FV Prox  Full                                                        +---------+---------------+---------+-----------+----------+--------------+ FV Mid   Full                                                        +---------+---------------+---------+-----------+----------+--------------+ FV DistalFull                                                        +---------+---------------+---------+-----------+----------+--------------+ PFV      Full                                                        +---------+---------------+---------+-----------+----------+--------------+ POP      Full           Yes      Yes                                 +---------+---------------+---------+-----------+----------+--------------+ PTV      Full                                                        +---------+---------------+---------+-----------+----------+--------------+ PERO     Full                                                        +---------+---------------+---------+-----------+----------+--------------+     Summary: RIGHT: - There is no evidence of deep vein thrombosis in the lower extremity.  - No cystic structure found in the popliteal fossa.  LEFT: - There is no evidence of deep vein thrombosis in the lower extremity.  - No  cystic structure found in the popliteal fossa.  *See table(s) above for measurements and observations.    Preliminary     PHYSICAL EXAM  Temp:  [98.2 F (36.8 C)-99.3 F (37.4 C)] 98.6 F (37 C) (05/17 1200) Pulse Rate:  [69-91] 91 (05/17 1200) Resp:  [12-20] 14 (05/17 1200) BP: (115-130)/(72-89) 115/77 (05/17 1200) SpO2:  [98 %-100 %] 98 % (05/17 1200)  General - Well nourished, well developed, in no apparent distress.  Ophthalmologic - fundi not visualized due to noncooperation.  Cardiovascular - Regular rhythm and rate.  Mental Status  -  Level of arousal and orientation to time, place, and person were intact. Language including expression, naming, repetition, comprehension was assessed and found intact. Attention span and concentration were normal. Fund of Knowledge was assessed and was intact.  Cranial Nerves II - XII - II - Visual field intact OU. III, IV, VI - Extraocular movements intact. V - Facial sensation intact bilaterally. VII - Facial movement intact bilaterally. VIII - Hearing & vestibular intact bilaterally X - Palate elevates symmetrically. XI - Chin turning & shoulder shrug intact bilaterally. XII - Tongue protrusion intact.  Motor Strength - The patient's strength was normal in all extremities and pronator drift was absent.  Bulk was normal and fasciculations were absent.   Motor Tone - Muscle tone was assessed at the neck and appendages and was normal.  Reflexes - The patient's reflexes were symmetrical in all extremities and she had no pathological reflexes.  Sensory - Light touch, temperature/pinprick were assessed and were symmetrical.    Coordination - The patient had normal movements in the hands and feet with no ataxia or dysmetria.  Tremor was absent.  Gait and Station - deferred.   ASSESSMENT/PLAN Autumn Haney is a 39 y.o. female with history of migraines presenting with dizziness, slurred speech and gait imbalance.   Stroke:   Embolic shower at posterior circulation, infarcts embolic secondary to unknown source  CT head B cerebellar infarct   CTA head & neck Unremarkable   MRI  Scattered B cerebellar, brainstem and L PCA territory infarcts  LE Doppler  Neg DVT  2D Echo pending  TEE to look for embolic source. Arranged with [facility] for tomorrow at 0800.  (I have made patient NPO after midnight tonight).  If above work up unrevealing, may consider LP, but very unlikely for CNS vasculitis given no clinical sign and CTA unremarkable   LDL  169  HgbA1c 5.6  ESR 7 and CRP 0.5  UDS neg  Hypercoagulable labs pending   SCDs for VTE prophylaxis  No antithrombotic prior to admission, now on aspirin 81 mg daily. No DAPT for now in case LP.   Therapy recommendations:  None  Disposition:  pending   Hyperlipidemia  Home meds:  No statin  Now on lipitor 80   LDL 169, goal < 70  Continue statin at discharge  Other Stroke Risk Factors  Migraines, rare  Thrombocytosis PLT 581->512    Hx miscarriages x 2  Other Active Problems  Microcytic anemia. Hb 10.6->9.6, Folate ok. Fe 14, TIBC 529, Sat ratio 3    Hospital day # 1   Rosalin Hawking, MD PhD Stroke Neurology 09/04/2019 1:12 PM   To contact Stroke Continuity provider, please refer to [email or web address] After hours, contact General Neurology

## 2019-09-04 NOTE — Progress Notes (Signed)
   [facility] has been requested to perform a transesophageal echocardiogram on Autumn Haney for stroke.  After careful review of history and examination, the risks and benefits of transesophageal echocardiogram have been explained including risks of esophageal damage, perforation (1:10,000 risk), bleeding, pharyngeal hematoma as well as other potential complications associated with conscious sedation including aspiration, arrhythmia, respiratory failure and death. Alternatives to treatment were discussed, questions were answered. Patient is willing to proceed.   Procedure scheduled for 09/05/2019 at 8:00am with Dr. Margaretann Loveless. Will place orders.   Of note, hemoglobin 9.6 today, down from 10.6 yesterday. No reports of abnormal bleeding. Repeat CBC already ordered for tomorrow morning so can ensure hemoglobin is stable then.  Darreld Mclean, PA-C 09/04/2019 2:43 PM

## 2019-09-04 NOTE — Progress Notes (Signed)
OT Cancellation Note  Patient Details Name: Autumn Haney MRN: [id number] DOB: Nov 30, 1980   Cancelled Treatment:    Reason Eval/Treat Not Completed: Patient at procedure or test/ unavailable- pt off unit at Echo.  Will follow and see as able.   Autumn Haney, OT Acute Rehabilitation Services Pager [phone] Office [phone]    Autumn Haney 09/04/2019, 8:50 AM

## 2019-09-05 ENCOUNTER — Inpatient Hospital Stay (HOSPITAL_COMMUNITY): Payer: BC Managed Care – PPO | Admitting: Certified Registered"

## 2019-09-05 ENCOUNTER — Inpatient Hospital Stay (HOSPITAL_COMMUNITY)
Admit: 2019-09-05 | Discharge: 2019-09-05 | Disposition: A | Discharge status: Home / Self Care | Payer: BC Managed Care – PPO | Attending: Student | Admitting: Student

## 2019-09-05 ENCOUNTER — Encounter (HOSPITAL_COMMUNITY): Payer: Self-pay | Admitting: Internal Medicine

## 2019-09-05 ENCOUNTER — Inpatient Hospital Stay (HOSPITAL_COMMUNITY): Payer: BC Managed Care – PPO

## 2019-09-05 ENCOUNTER — Encounter (HOSPITAL_COMMUNITY)
Admission: EM | Disposition: A | Discharge status: Home / Self Care | Payer: Self-pay | Source: Home / Self Care | Attending: Internal Medicine

## 2019-09-05 DIAGNOSIS — I63442 Cerebral infarction due to embolism of left cerebellar artery: Secondary | ICD-10-CM

## 2019-09-05 DIAGNOSIS — I639 Cerebral infarction, unspecified: Secondary | ICD-10-CM

## 2019-09-05 HISTORY — PX: BUBBLE STUDY: SHX6837

## 2019-09-05 HISTORY — PX: TEE WITHOUT CARDIOVERSION: SHX5443

## 2019-09-05 LAB — BASIC METABOLIC PANEL
Anion gap: 10 (ref 5–15)
BUN: 15 mg/dL (ref 6–20)
CO2: 26 mmol/L (ref 22–32)
Calcium: 9 mg/dL (ref 8.9–10.3)
Chloride: 102 mmol/L (ref 98–111)
Creatinine, Ser: 0.73 mg/dL (ref 0.44–1.00)
GFR calc Af Amer: >60 mL/min (ref >60)
GFR calc non Af Amer: >60 mL/min (ref >60)
Glucose, Bld: 91 mg/dL (ref 70–99)
Potassium: 3.3 mmol/L — ABNORMAL LOW (ref 3.5–5.1)
Sodium: 138 mmol/L (ref 135–145)

## 2019-09-05 LAB — LUPUS ANTICOAGULANT PANEL
DRVVT: 26.9 s (ref 0.0–47.0)
PTT Lupus Anticoagulant: 28.2 s (ref 0.0–51.9)

## 2019-09-05 LAB — CBC
HCT: 31.4 % — ABNORMAL LOW (ref 36.0–46.0)
Hemoglobin: 9.4 g/dL — ABNORMAL LOW (ref 12.0–15.0)
MCH: 22.9 pg — ABNORMAL LOW (ref 26.0–34.0)
MCHC: 29.9 g/dL — ABNORMAL LOW (ref 30.0–36.0)
MCV: 76.4 fL — ABNORMAL LOW (ref 80.0–100.0)
Platelets: 511 10*3/uL — ABNORMAL HIGH (ref 150–400)
RBC: 4.11 MIL/uL (ref 3.87–5.11)
RDW: 14.7 % (ref 11.5–15.5)
WBC: 6.5 10*3/uL (ref 4.0–10.5)
nRBC: 0 % (ref 0.0–0.2)

## 2019-09-05 LAB — MAGNESIUM: Magnesium: 1.8 mg/dL (ref 1.7–2.4)

## 2019-09-05 LAB — PROTEIN S, TOTAL: Protein S Ag, Total: 86 % (ref 60–150)

## 2019-09-05 LAB — HOMOCYSTEINE: Homocysteine: 10.2 umol/L (ref 0.0–14.5)

## 2019-09-05 LAB — PROTEIN C ACTIVITY: Protein C Activity: 117 % (ref 73–180)

## 2019-09-05 LAB — CARDIOLIPIN ANTIBODIES, IGG, IGM, IGA
Anticardiolipin IgA: <9 APL U/mL (ref 0–11)
Anticardiolipin IgG: <9 GPL U/mL (ref 0–14)
Anticardiolipin IgM: 12 MPL U/mL (ref 0–12)

## 2019-09-05 LAB — BETA-2-GLYCOPROTEIN I ABS, IGG/M/A
Beta-2 Glyco I IgG: <9 GPI IgG units (ref 0–20)
Beta-2-Glycoprotein I IgA: <9 GPI IgA units (ref 0–25)
Beta-2-Glycoprotein I IgM: <9 GPI IgM units (ref 0–32)

## 2019-09-05 LAB — ANA: Anti Nuclear Antibody (ANA): NEGATIVE

## 2019-09-05 LAB — PROTEIN S ACTIVITY: Protein S Activity: 72 % (ref 63–140)

## 2019-09-05 LAB — PHOSPHORUS: Phosphorus: 5.2 mg/dL — ABNORMAL HIGH (ref 2.5–4.6)

## 2019-09-05 SURGERY — ECHOCARDIOGRAM, TRANSESOPHAGEAL
Anesthesia: Monitor Anesthesia Care

## 2019-09-05 MED ORDER — FERROUS SULFATE 325 (65 FE) MG PO TABS: 325.0000 mg | ORAL_TABLET | Freq: Every day | ORAL | 0 refills | Status: DC

## 2019-09-05 MED ORDER — SODIUM CHLORIDE 0.9 % IV SOLN: INTRAVENOUS | Status: DC | PRN

## 2019-09-05 MED ORDER — ASPIRIN 81 MG PO TBEC: 81.0000 mg | DELAYED_RELEASE_TABLET | Freq: Every day | ORAL | 0 refills | Status: DC

## 2019-09-05 MED ORDER — SODIUM CHLORIDE 0.9 % IV SOLN: INTRAVENOUS | Status: DC

## 2019-09-05 MED ORDER — SODIUM CHLORIDE BACTERIOSTATIC 0.9 % IJ SOLN
INTRAMUSCULAR | Status: DC | PRN
  Administered 2019-09-05: 9 mL via INTRAVENOUS

## 2019-09-05 MED ORDER — POTASSIUM CHLORIDE CRYS ER 20 MEQ PO TBCR
40.0000 meq | EXTENDED_RELEASE_TABLET | Freq: Two times a day (BID) | ORAL | Status: DC
  Administered 2019-09-05: 40 meq via ORAL
  Filled 2019-09-05: qty 2

## 2019-09-05 MED ORDER — CLOPIDOGREL BISULFATE 75 MG PO TABS: 75.0000 mg | ORAL_TABLET | Freq: Every day | ORAL | 0 refills | Status: AC

## 2019-09-05 MED ORDER — POTASSIUM CHLORIDE CRYS ER 20 MEQ PO TBCR: 40.0000 meq | EXTENDED_RELEASE_TABLET | Freq: Every day | ORAL | 0 refills | Status: DC

## 2019-09-05 MED ORDER — BUTAMBEN-TETRACAINE-BENZOCAINE 2-2-14 % EX AERO
INHALATION_SPRAY | CUTANEOUS | Status: DC | PRN
  Administered 2019-09-05: 2 via TOPICAL

## 2019-09-05 MED ORDER — PROPOFOL 10 MG/ML IV BOLUS
INTRAVENOUS | Status: DC | PRN
  Administered 2019-09-05 (×2): 20 mg via INTRAVENOUS

## 2019-09-05 MED ORDER — LIDOCAINE HCL (CARDIAC) PF 100 MG/5ML IV SOSY
PREFILLED_SYRINGE | INTRAVENOUS | Status: DC | PRN
  Administered 2019-09-05: 50 mg via INTRAVENOUS

## 2019-09-05 MED ORDER — CLOPIDOGREL BISULFATE 75 MG PO TABS
75.0000 mg | ORAL_TABLET | Freq: Every day | ORAL | Status: DC
  Administered 2019-09-05: 75 mg via ORAL
  Filled 2019-09-05: qty 1

## 2019-09-05 MED ORDER — ATORVASTATIN CALCIUM 80 MG PO TABS: 80.0000 mg | ORAL_TABLET | Freq: Every day | ORAL | 0 refills | Status: DC

## 2019-09-05 MED ORDER — ASPIRIN 81 MG PO TBEC: 81.0000 mg | DELAYED_RELEASE_TABLET | Freq: Every day | ORAL | 0 refills | Status: AC

## 2019-09-05 MED ORDER — PROPOFOL 500 MG/50ML IV EMUL
INTRAVENOUS | Status: DC | PRN
  Administered 2019-09-05: 175 ug/kg/min via INTRAVENOUS

## 2019-09-05 MED FILL — FERROUS SULFATE 325 MG TAB: 325 (65 FE) | 90 days supply | Qty: 90 | Fill #0

## 2019-09-05 MED FILL — ASPIRIN LOW DOSE 81 MG TBEC: 81 | 90 days supply | Qty: 90 | Fill #0

## 2019-09-05 MED FILL — ATORVASTATIN CALCIUM 80 MG: 80 | 90 days supply | Qty: 90 | Fill #0

## 2019-09-05 MED FILL — CLOPIDOGREL 75 MG TABLET: 75 | 21 days supply | Qty: 21 | Fill #0

## 2019-09-05 MED FILL — POTASSIUM CHLORIDE 20meqER: 20 | 3 days supply | Qty: 6 | Fill #0

## 2019-09-05 NOTE — Progress Notes (Addendum)
STROKE TEAM PROGRESS NOTE   INTERVAL HISTORY Husband at bedside. Pt sitting in bed comfortably. Had TEE today showed no PFO or other abnormalities. TCD bubble study also confirmed negative for PFO. Discussed about LP and loop recorder, felt very low yield. Will not do this time. Will follow up with Dr. Leonie Man to decide if needed.    Vitals:   09/05/19 0913 09/05/19 0923 09/05/19 0940 09/05/19 1206  BP: (!) 95/52 118/73 120/82 121/78  Pulse: 71 78 69 86  Resp: 13 15 18 16   Temp:   97.7 F (36.5 C) 98.2 F (36.8 C)  TempSrc:   Oral Oral  SpO2: 100% 100% 100% 98%  Weight:      Height:       CBC:  Recent Labs  Lab 09/03/19 2025 09/05/19 0231  WBC 5.8 6.5  NEUTROABS 3.2  --   HGB 9.6* 9.4*  HCT 30.6* 31.4*  MCV 75.0* 76.4*  PLT 512* 357*   Basic Metabolic Panel:  Recent Labs  Lab 09/02/19 2122 09/05/19 0231  NA 135 138  K 3.9 3.3*  CL 102 102  CO2 23 26  GLUCOSE 99 91  BUN 16 15  CREATININE 0.70 0.73  CALCIUM 9.0 9.0  MG  --  1.8  PHOS  --  5.2*   Lipid Panel:     Component Value Date/Time   CHOL 239 (H) 09/04/2019 0349   TRIG 90 09/04/2019 0349   HDL 52 09/04/2019 0349   CHOLHDL 4.6 09/04/2019 0349   VLDL 18 09/04/2019 0349   LDLCALC 169 (H) 09/04/2019 0349   HgbA1c:  Lab Results  Component Value Date   HGBA1C 5.6 09/03/2019   Urine Drug Screen:     Component Value Date/Time   LABOPIA NONE DETECTED 09/02/2019 1839   COCAINSCRNUR NONE DETECTED 09/02/2019 1839   LABBENZ NONE DETECTED 09/02/2019 1839   AMPHETMU NONE DETECTED 09/02/2019 1839   THCU NONE DETECTED 09/02/2019 1839   LABBARB NONE DETECTED 09/02/2019 1839    Alcohol Level No results found for: [facility]  IMAGING past 24 hours ECHO TEE  Result Date: 09/05/2019    TRANSESOPHOGEAL ECHO REPORT   Patient Name:   Autumn Haney Date of Exam: 09/05/2019 Medical Rec #:  [id number]       Height:       64.0 in Accession #:    [id number]      Weight:       135.0 lb Date of Birth:  [birth year]       BSA:           1.655 m Patient Age:    39 years        BP:           140/102 mmHg Patient Gender: F               HR:           86 bpm. Exam Location:  Inpatient Procedure: Transesophageal Echo, Color Doppler and Cardiac Doppler Indications:     stroke  History:         Patient has prior history of Echocardiogram examinations, most                  recent 09/04/2019.  Sonographer:     Johny Chess Referring Phys:  [id number] [place] Diagnosing Phys: Cherlynn Kaiser MD PROCEDURE: After discussion of the risks and benefits of a TEE, an informed consent was obtained from the patient. TEE procedure time was  29 minutes. The transesophogeal probe was passed without difficulty through the esophogus of the patient. Imaged were obtained with the patient in a left lateral decubitus position. Local oropharyngeal anesthetic was provided with Cetacaine. Sedation performed by different physician. Image quality was excellent. The patient's vital signs; including heart rate, blood pressure, and oxygen saturation; remained stable throughout the procedure. The patient developed no complications during the procedure. IMPRESSIONS  1. Left ventricular ejection fraction, by estimation, is 60 to 65%. The left ventricle has normal function. The left ventricle has no regional wall motion abnormalities.  2. Right ventricular systolic function is normal. The right ventricular size is normal.  3. No left atrial/left atrial appendage thrombus was detected. The LAA emptying velocity was 73 cm/s.  4. The mitral valve is grossly normal. Trivial mitral valve regurgitation.  5. The aortic valve is tricuspid. Aortic valve regurgitation is not visualized. No aortic stenosis is present.  6. There is Mild-moderate (Grade II-III) atheroma plaque involving the aortic arch.  7. Agitated saline contrast bubble study was negative, with no evidence of any interatrial shunt. The atrial septum is redundant with possible tiny amount of color flow seen between  the fossa ovalis flap. No definite atrial level shunt by color flow Doppler. FINDINGS  Left Ventricle: Left ventricular ejection fraction, by estimation, is 60 to 65%. The left ventricle has normal function. The left ventricle has no regional wall motion abnormalities. The left ventricular internal cavity size was normal in size. There is  no left ventricular hypertrophy. Right Ventricle: The right ventricular size is normal. No increase in right ventricular wall thickness. Right ventricular systolic function is normal. Left Atrium: Left atrial size was normal in size. No left atrial/left atrial appendage thrombus was detected. The LAA emptying velocity was 73 cm/s. Right Atrium: Right atrial size was normal in size. Pericardium: There is no evidence of pericardial effusion. Mitral Valve: Degenerative strands on atrial surface of valve. The mitral valve is grossly normal. Trivial mitral valve regurgitation. Tricuspid Valve: The tricuspid valve is normal in structure. Tricuspid valve regurgitation is trivial. Aortic Valve: The aortic valve is tricuspid. Aortic valve regurgitation is not visualized. No aortic stenosis is present. Pulmonic Valve: The pulmonic valve was normal in structure. Pulmonic valve regurgitation is trivial. Aorta: The aortic root and ascending aorta are structurally normal, with no evidence of dilitation. There is mild-moderate (Grade II-III) atheroma plaque involving the transverse aorta. IAS/Shunts: There is redundancy of the interatrial septum. No atrial level shunt detected by color flow Doppler. Agitated saline contrast was given intravenously to evaluate for intracardiac shunting. Agitated saline contrast bubble study was negative, with no evidence of any interatrial shunt.   AORTA Ao Root diam: 2.80 cm Ao Asc diam:  2.80 cm Cherlynn Kaiser MD Electronically signed by Cherlynn Kaiser MD Signature Date/Time: 09/05/2019/10:40:07 AM    Final     PHYSICAL EXAM   Temp:  [97.7 F (36.5  C)-99.1 F (37.3 C)] 98.2 F (36.8 C) (05/18 1206) Pulse Rate:  [69-88] 86 (05/18 1206) Resp:  [13-18] 16 (05/18 1206) BP: (83-130)/(45-99) 121/78 (05/18 1206) SpO2:  [98 %-100 %] 98 % (05/18 1206) Weight:  [61.2 kg] 61.2 kg (05/18 0713)  General - Well nourished, well developed, in no apparent distress.  Ophthalmologic - fundi not visualized due to noncooperation.  Cardiovascular - Regular rhythm and rate.  Mental Status -  Level of arousal and orientation to time, place, and person were intact. Language including expression, naming, repetition, comprehension was assessed and found intact.  Attention span and concentration were normal. Fund of Knowledge was assessed and was intact.  Cranial Nerves II - XII - II - Visual field intact OU. III, IV, VI - Extraocular movements intact. V - Facial sensation intact bilaterally. VII - Facial movement intact bilaterally. VIII - Hearing & vestibular intact bilaterally X - Palate elevates symmetrically. XI - Chin turning & shoulder shrug intact bilaterally. XII - Tongue protrusion intact.  Motor Strength - The patient's strength was normal in all extremities and pronator drift was absent.  Bulk was normal and fasciculations were absent.   Motor Tone - Muscle tone was assessed at the neck and appendages and was normal.  Reflexes - The patient's reflexes were symmetrical in all extremities and she had no pathological reflexes.  Sensory - Light touch, temperature/pinprick were assessed and were symmetrical.    Coordination - The patient had normal movements in the hands and feet with no ataxia or dysmetria.  Tremor was absent.  Gait and Station - deferred.   ASSESSMENT/PLAN Autumn Haney is a 39 y.o. female with history of migraines presenting with dizziness, slurred speech and gait imbalance.   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 No source of embolus. Neg bubble  TEE EF 60-65%. Redundant IAS.  Possible flow between flap. No shunt  TCD bubble no PFO  Considered LP and loop recorder but felt very low yield given her young age, no underlying afib risk factors, normal inflammation markers, no clinical sign and CTA unremarkable for vasculitis. Pt would like to have second opinion when follow up with Dr. Leonie Man as outpt and will consider if needed at that time.  LDL 169  HgbA1c 5.6  ESR 7 and CRP 0.5  UDS neg  Hypercoagulable labs pending   SCDs for VTE prophylaxis  No antithrombotic prior to admission, now on aspirin 81 mg daily. Recommend ASA 81 and plavix for 3 weeks and then ASA 81 alone.   Therapy recommendations:  None  Disposition:  Return home  Follow up Stroke clinic in 4 weeks, order placed  Hyperlipidemia  Home meds:  No statin  Now on lipitor 80   LDL 169, goal < 70  Continue statin at discharge  Thrombocytosis and anemia   Platelet 300s in 2016  Now PLT 581->512->511  However Microcytic anemia. Hb 10.6->9.6, Folate ok.   Fe 14, TIBC 529 - on iron supplement  Thrombocytosis most likely reactive to anemia  Continue follow up with PCP and consider oncology referral if needed  Other Stroke Risk Factors  Migraines, rare  Hx miscarriages x 2 - hypercoagulable labs pending  Other Active Problems  COVID vaccine second dose 06/27/19  Pregnancy test neg   Hospital day # 2  Neurology will sign off. Please call with questions. Pt will follow up with stroke clinic Dr. Leonie Man at [facility] in about 4 weeks. Thanks for the consult.   Rosalin Hawking, MD PhD Stroke Neurology 09/05/2019 1:12 PM   To contact Stroke Continuity provider, please refer to [email or web address] After hours, contact General Neurology

## 2019-09-05 NOTE — Progress Notes (Signed)
  Echocardiogram Echocardiogram Transesophageal has been performed.  Autumn Haney 09/05/2019, 9:13 AM

## 2019-09-05 NOTE — Discharge Instructions (Signed)
Preventing High Cholesterol Cholesterol is a white, waxy substance similar to fat that the human body needs to help build cells. The liver makes all the cholesterol that a person's body needs. Having high cholesterol (hypercholesterolemia) increases a person's risk for heart disease and stroke. Extra (excess) cholesterol comes from the food the person eats. High cholesterol can often be prevented with diet and lifestyle changes. If you already have high cholesterol, you can control it with diet and lifestyle changes and with medicine. How can high cholesterol affect me? If you have high cholesterol, deposits (plaques) may build up on the walls of your arteries. The arteries are the blood vessels that carry blood away from your heart. Plaques make the arteries narrower and stiffer. This can limit or block blood flow and cause blood clots to form. Blood clots:  Are tiny balls of cells that form in your blood.  Can move to the heart or brain, causing a heart attack or stroke. Plaques in arteries greatly increase your risk for heart attack and stroke.Making diet and lifestyle changes can reduce your risk for these conditions that may threaten your life. What can increase my risk? This condition is more likely to develop in people who:  Eat foods that are high in saturated fat or cholesterol. Saturated fat is mostly found in: ? Foods that contain animal fat, such as red meat and some dairy products. ? Certain fatty foods made from plants, such as tropical oils.  Are overweight.  Are not getting enough exercise.  Have a family history of high cholesterol. What actions can I take to prevent this? Nutrition   Eat less saturated fat.  Avoid trans fats (partially hydrogenated oils). These are often found in margarine and in some baked goods, fried foods, and snacks bought in packages.  Avoid precooked or cured meat, such as sausages or meat loaves.  Avoid foods and drinks that have added  sugars.  Eat more fruits, vegetables, and whole grains.  Choose healthy sources of protein, such as fish, poultry, lean cuts of red meat, beans, peas, lentils, and nuts.  Choose healthy sources of fat, such as: ? Nuts. ? Vegetable oils, especially olive oil. ? Fish that have healthy fats (omega-3 fatty acids), such as mackerel or salmon. The items listed above may not be a complete list of recommended foods and beverages. Contact a dietitian for more information. Lifestyle  Lose weight if you are overweight. Losing 5-10 lb (2.3-4.5 kg) can help prevent or control high cholesterol. It can also lower your risk for diabetes and high blood pressure. Ask your health care provider to help you with a diet and exercise plan to lose weight safely.  Do not use any products that contain nicotine or tobacco, such as cigarettes, e-cigarettes, and chewing tobacco. If you need help quitting, ask your health care provider.  Limit your alcohol intake. ? Do not drink alcohol if:  Your health care provider tells you not to drink.  You are pregnant, may be pregnant, or are planning to become pregnant. ? If you drink alcohol:  Limit how much you use to:  0-1 drink a day for women.  0-2 drinks a day for men.  Be aware of how much alcohol is in your drink. In the [place] one drink equals one 12 oz bottle of beer (355 mL), one 5 oz glass of wine (148 mL), or one 1 oz glass of hard liquor (44 mL). Activity   Get enough exercise. Each week, do at   least 150 minutes of exercise that takes a medium level of effort (moderate-intensity exercise). ? This is exercise that:  Makes your heart beat faster and makes you breathe harder than usual.  Allows you to still be able to talk. ? You could exercise in short sessions several times a day or longer sessions a few times a week. For example, on 5 days each week, you could walk fast or ride your bike 3 times a day for 10 minutes each time.  Do exercises as told  by your health care provider. Medicines  In addition to diet and lifestyle changes, your health care provider may recommend medicines to help lower cholesterol. This may be a medicine to lower the amount of cholesterol your liver makes. You may need medicine if: ? Diet and lifestyle changes do not lower your cholesterol enough. ? You have high cholesterol and other risk factors for heart disease or stroke.  Take over-the-counter and prescription medicines only as told by your health care provider. General information  Manage your risk factors for high cholesterol. Talk with your health care provider about all your risk factors and how to lower your risk.  Manage other conditions that you have, such as diabetes or high blood pressure (hypertension).  Have blood tests to check your cholesterol levels at regular points in time as told by your health care provider.  Keep all follow-up visits as told by your health care provider. This is important. Where to find more information  American Heart Association: www.heart.org  National Heart, Lung, and Blood Institute: www.nhlbi.nih.gov Summary  High cholesterol increases your risk for heart disease and stroke. By keeping your cholesterol level low, you can reduce your risk for these conditions.  High cholesterol can often be prevented with diet and lifestyle changes.  Work with your health care provider to manage your risk factors, and have your blood tested regularly. This information is not intended to replace advice given to you by your health care provider. Make sure you discuss any questions you have with your health care provider. Document Revised: 07/29/2018 Document Reviewed: 12/14/2015 Elsevier Patient Education  2020 [employer] High Cholesterol  High cholesterol is a condition in which the blood has high levels of a white, waxy, fat-like substance (cholesterol). The human body needs small amounts of cholesterol. The liver makes  all the cholesterol that the body needs. Extra (excess) cholesterol comes from the food that we eat. Cholesterol is carried from the liver by the blood through the blood vessels. If you have high cholesterol, deposits (plaques) may build up on the walls of your blood vessels (arteries). Plaques make the arteries narrower and stiffer. Cholesterol plaques increase your risk for heart attack and stroke. Work with your health care provider to keep your cholesterol levels in a healthy range. What increases the risk? This condition is more likely to develop in people who:  Eat foods that are high in animal fat (saturated fat) or cholesterol.  Are overweight.  Are not getting enough exercise.  Have a family history of high cholesterol. What are the signs or symptoms? There are no symptoms of this condition. How is this diagnosed? This condition may be diagnosed from the results of a blood test.  If you are older than age 20, your health care provider may check your cholesterol every 4-6 years.  You may be checked more often if you already have high cholesterol or other risk factors for heart disease. The blood test for cholesterol measures:  "  Bad" cholesterol (LDL cholesterol). This is the main type of cholesterol that causes heart disease. The desired level for LDL is less than 100.  "Good" cholesterol (HDL cholesterol). This type helps to protect against heart disease by cleaning the arteries and carrying the LDL away. The desired level for HDL is 60 or higher.  Triglycerides. These are fats that the body can store or burn for energy. The desired number for triglycerides is lower than 150.  Total cholesterol. This is a measure of the total amount of cholesterol in your blood, including LDL cholesterol, HDL cholesterol, and triglycerides. A healthy number is less than 200. How is this treated? This condition is treated with diet changes, lifestyle changes, and medicines. Diet changes  This  may include eating more whole grains, fruits, vegetables, nuts, and fish.  This may also include cutting back on red meat and foods that have a lot of added sugar. Lifestyle changes  Changes may include getting at least 40 minutes of aerobic exercise 3 times a week. Aerobic exercises include walking, biking, and swimming. Aerobic exercise along with a healthy diet can help you maintain a healthy weight.  Changes may also include quitting smoking. Medicines  Medicines are usually given if diet and lifestyle changes have failed to reduce your cholesterol to healthy levels.  Your health care provider may prescribe a statin medicine. Statin medicines have been shown to reduce cholesterol, which can reduce the risk of heart disease. Follow these instructions at home: Eating and drinking If told by your health care provider:  Eat chicken (without skin), fish, veal, shellfish, ground [place] breast, and round or loin cuts of red meat.  Do not eat fried foods or fatty meats, such as hot dogs and salami.  Eat plenty of fruits, such as apples.  Eat plenty of vegetables, such as broccoli, potatoes, and carrots.  Eat beans, peas, and lentils.  Eat grains such as barley, rice, couscous, and bulgur wheat.  Eat pasta without cream sauces.  Use skim or nonfat milk, and eat low-fat or nonfat yogurt and cheeses.  Do not eat or drink whole milk, cream, ice cream, egg yolks, or hard cheeses.  Do not eat stick margarine or tub margarines that contain trans fats (also called partially hydrogenated oils).  Do not eat saturated tropical oils, such as coconut oil and palm oil.  Do not eat cakes, cookies, crackers, or other baked goods that contain trans fats.  General instructions  Exercise as directed by your health care provider. Increase your activity level with activities such as gardening, walking, and taking the stairs.  Take over-the-counter and prescription medicines only as told by your  health care provider.  Do not use any products that contain nicotine or tobacco, such as cigarettes and e-cigarettes. If you need help quitting, ask your health care provider.  Keep all follow-up visits as told by your health care provider. This is important. Contact a health care provider if:  You are struggling to maintain a healthy diet or weight.  You need help to start on an exercise program.  You need help to stop smoking. Get help right away if:  You have chest pain.  You have trouble breathing. This information is not intended to replace advice given to you by your health care provider. Make sure you discuss any questions you have with your health care provider. Document Revised: 04/09/2017 Document Reviewed: 10/05/2015 Elsevier Patient Education  2020 [place] After Stroke A stroke causes damage to  the brain cells, which can affect your ability to walk, talk, and even eat. The impact of a stroke is different for everyone, and so is recovery. A good nutrition plan is important for your recovery. It can also lower your risk of another stroke. If you have difficulty chewing and swallowing your food, a dietitian or your stroke care team can help so that you can enjoy eating healthy foods. What are tips for following this plan?  Reading food labels  Choose foods that have less than 300 milligrams (mg) of sodium per serving. Limit your sodium intake to less than 1,500 mg per day.  Avoid foods that have saturated fat and trans fat.  Choose foods that are low in cholesterol. Limit the amount of cholesterol you eat each day to less than 200 mg.  Choose foods that are high in fiber. Eat 20-30 grams (g) of fiber each day.  Avoid foods with added sugar. Check the food label for ingredients such as sugar, corn syrup, honey, fructose, molasses, and cane juice. Shopping  At the grocery store, buy most of your food from areas near the walls of the store. This  includes: ? Fresh fruits and vegetables. ? Dry grains, beans, nuts, and seeds. ? Fresh seafood, poultry, lean meats, and eggs. ? Low-fat dairy products.  Buy whole ingredients instead of prepackaged foods.  Buy fresh, in-season fruits and vegetables from local farmers markets.  Buy frozen fruits and vegetables in resealable bags. Cooking  Prepare foods with very little salt. Use herbs or salt-free spices instead.  Cook with heart-healthy oils, such as olive, avocado, canola, soybean, or sunflower oil.  Avoid frying foods. Bake, grill, or broil foods instead.  Remove visible fat and skin from meat and poultry before eating.  Modify food textures as told by your health care provider. Meal planning  Eat a wide variety of colorful fruits and vegetables. Make sure one-half of your plate is filled with fruits and vegetables at each meal.  Eat fruits and vegetables that are high in potassium, such as: ? Apples, bananas, oranges, and melon. ? Sweet potatoes, spinach, zucchini, and tomatoes.  Eat fish that contain heart-healthy fats (omega-3 fats) at least twice a week. These include salmon, tuna, mackerel, and sardines.  Eat plant foods that are high in omega-3 fats, such as flaxseeds and walnuts. Add these to cereals, yogurt, or pasta dishes.  Eat several servings of high-fiber foods each day, such as fruits, vegetables, whole grains, and beans.  Do not put salt at the table for meals.  When eating out at restaurants: ? Ask the server about low-salt or salt-free food options. ? Avoid fried foods. Look for menu items that are grilled, steamed, broiled, or roasted. ? Ask if your food can be prepared without butter. ? Ask for condiments, such as salad dressings, gravy, or sauces to be served on the side.  If you have difficulty swallowing: ? Choose foods that are softer and easier to chew and swallow. ? Cut foods into small pieces and chew well before swallowing. ? Thicken  liquids as told by your health care provider or dietitian. ? Let your health care provider know if your condition does not improve over time. You may need to work with a speech therapist to re-train the muscles that are used for eating. General recommendations  Involve your family and friends in your recovery, if possible. It may be helpful to have a slower meal time and to plan meals that include foods everyone  in the family can eat.  Brush your teeth with fluoride toothpaste twice a day, and floss once a day. Keeping a clean mouth can help you swallow and can also help your appetite.  Drink enough water each day to keep your urine pale yellow. If needed, set reminders or ask your family to help you remember to drink water.  Limit alcohol intake to no more than 1 drink a day for nonpregnant women and 2 drinks a day for men. One drink equals 12 oz of beer, 5 oz of wine, or 1 oz of hard liquor. Summary  Following this eating plan can help in your stroke recovery and can decrease your risk for another stroke.  Let your health care provider know if you have problems with swallowing. You may need to work with a speech therapist. This information is not intended to replace advice given to you by your health care provider. Make sure you discuss any questions you have with your health care provider. Document Revised: 07/28/2018 Document Reviewed: 06/14/2017 Elsevier Patient Education  [place] After a Stroke Caregivers provide essential physical and emotional support to people who have had a stroke. If you are supporting someone who has had a stroke, you play an important role in coordinating schedules, helping your loved one to communicate, and helping with the overall rehabilitation plan. What do I need to know about my loved one's recovery? Recovering from a stroke can take weeks, months, or years. Some people may be able to return to a normal lifestyle, and other  people may have permanent problems with movement (mobility), thinking, behavior, or communication. Understanding your loved one's condition can help you manage the role of caregiver. Your loved one's health care team may rely on you to provide information such as medical history and current medicines. How can I support my friend or family member? Planning for discharge from the hospital Ask to meet with a social worker or a stroke care coordinator, if one is available. This person can help you to plan for discharge. Before you leave the hospital, make sure you understand:  The recovery process after a stroke.  Physical, emotional, behavioral, and other changes that may affect your loved one after a stroke.  The treatments for stroke, including the medicines that your loved one has to take.  How to lower the risk of another stroke.  Diet and exercise changes for your loved one.  Whether you will need extra help at home.  Whether your loved one will need help using the bathroom, bathing, eating, or doing other activities.  Changes you have to make at home to make it safe for your loved one.  Whether you need to get special devices or equipment for your loved one. General help  Help your loved one establish a daily routine. This may include setting reminders or having a shared day planner or calendar.  Encourage rest. Your loved one may need frequent breaks during social situations or other activities.  Be patient. Your loved one may take longer to complete tasks and to process information.  When giving instructions, give only one instruction at a time or give step-by-step lists. Multitasking can be difficult after a stroke.  Offer assistance with household chores or other daily tasks. Make "freezer meals" that can be reheated.  Do not set expectations about your loved one's recovery. Medical visits  Gently remind your loved one of tasks and medical visits if he or she is  forgetful.  Provide transportation to and from appointments.  Attend rehabilitation appointments with your loved one. By being involved in the rehabilitation plan, you can encourage your loved one and help with exercises and therapy activities at home. Preventing falls   Follow instructions to prevent falls in your loved one's home. These may include: ? Installing grab bars in bathrooms and handrails in stairways. ? Using night-lights in the bedroom, bathroom, or hallways. ? Removing rugs and mats or making them stick to the floor. ? Keeping walkways clear by removing cords and clutter from the floor. Managing finances  Help to manage your loved one's finances. Talk with a Education officer, museum or legal professional if: ? Your loved one is unable to return to work and is in need of financial help. ? You need help paying medical bills. ? You need to establish guardianship over finances. ? You need help with estate planning. How should I care for myself? Helping a loved one recover from a stroke can be rewarding, and it can also be challenging and stressful at times. Make sure to care for your own well-being during this time. Lifestyle  Rest. Try to get 7-9 hours of uninterrupted sleep each night.  Eat a balanced diet that includes fresh fruits and vegetables, whole grains, lean proteins, and low-fat dairy.  Exercise for 30 or more minutes on 5 or more days each week.  Find ways to manage stress. These may include: ? Deep breathing, yoga, or meditation. ? Spending time outdoors. ? Journaling. Finding support   Ask for help. Take a break if you are the primary caregiver to your loved one.  Spend time with supportive people.  Join a support group with other caregivers or family members of people who have had a stroke.  If you experience new or worsening depression or anxiety, seek counseling from a mental health professional. Where to find more information You may find more  information about supporting someone who has had a stroke from:  National Stroke Association: [employer]dk  American Stroke Association: www.strokeassociation.org/STROKEORG Summary  Caregivers provide essential physical and emotional support to people who have had a stroke.  Before you leave the hospital, make sure you understand how to care for someone who has had a stroke.  It is normal to have many different emotions while caring for someone who has had a stroke. Make sure to care for your own well-being during this time. This information is not intended to replace advice given to you by your health care provider. Make sure you discuss any questions you have with your health care provider. Document Revised: 07/28/2018 Document Reviewed: 07/24/2016 Elsevier Patient Education  [place].

## 2019-09-05 NOTE — Anesthesia Preprocedure Evaluation (Addendum)
Anesthesia Evaluation  Patient identified by MRN, date of birth, ID band Patient awake    Reviewed: Allergy & Precautions, NPO status , Patient's Chart, lab work & pertinent test results  Airway Mallampati: I  TM Distance: >3 FB Neck ROM: Full    Dental no notable dental hx.    Pulmonary neg pulmonary ROS,    Pulmonary exam normal breath sounds clear to auscultation       Cardiovascular negative cardio ROS Normal cardiovascular exam Rhythm:Regular Rate:Normal     Neuro/Psych  Headaches, CVA negative psych ROS   GI/Hepatic negative GI ROS, Neg liver ROS,   Endo/Other  negative endocrine ROS  Renal/GU negative Renal ROS     Musculoskeletal negative musculoskeletal ROS (+)   Abdominal   Peds  Hematology  (+) anemia , Thrombocytosis    Anesthesia Other Findings STROKE  Reproductive/Obstetrics hcg negative                            Anesthesia Physical Anesthesia Plan  ASA: II  Anesthesia Plan: MAC   Post-op Pain Management:    Induction: Intravenous  PONV Risk Score and Plan: 2 and Propofol infusion and Treatment may vary due to age or medical condition  Airway Management Planned: Nasal Cannula  Additional Equipment:   Intra-op Plan:   Post-operative Plan:   Informed Consent: I have reviewed the patients History and Physical, chart, labs and discussed the procedure including the risks, benefits and alternatives for the proposed anesthesia with the patient or authorized representative who has indicated his/her understanding and acceptance.     Dental advisory given  Plan Discussed with: CRNA  Anesthesia Plan Comments:        Anesthesia Quick Evaluation

## 2019-09-05 NOTE — Anesthesia Postprocedure Evaluation (Signed)
Anesthesia Post Note  Patient: Autumn Haney  Procedure(s) Performed: TRANSESOPHAGEAL ECHOCARDIOGRAM (TEE) (N/A ) BUBBLE STUDY     Patient location during evaluation: Endoscopy Anesthesia Type: MAC Level of consciousness: awake and alert Pain management: pain level controlled Vital Signs Assessment: post-procedure vital signs reviewed and stable Respiratory status: spontaneous breathing, nonlabored ventilation, respiratory function stable and patient connected to nasal cannula oxygen Cardiovascular status: stable and blood pressure returned to baseline Postop Assessment: no apparent nausea or vomiting Anesthetic complications: no    Last Vitals:  Vitals:   09/05/19 0940 09/05/19 1206  BP: 120/82 121/78  Pulse: 69 86  Resp: 18 16  Temp: 36.5 C 36.8 C  SpO2: 100% 98%    Last Pain:  Vitals:   09/05/19 1206  TempSrc: Oral  PainSc:                  Karyl Kinnier Escarlet Saathoff

## 2019-09-05 NOTE — CV Procedure (Addendum)
INDICATIONS: stroke  PROCEDURE:   Informed consent was obtained prior to the procedure. The risks, benefits and alternatives for the procedure were discussed and the patient comprehended these risks.  Risks include, but are not limited to, cough, sore throat, vomiting, nausea, somnolence, esophageal and stomach trauma or perforation, bleeding, low blood pressure, aspiration, pneumonia, infection, trauma to the teeth and death.    After a procedural time-out, the oropharynx was anesthetized with 20% benzocaine spray.   During this procedure the patient was administered propofol per anesthesia for deep monitored sedation.  The patient's heart rate, blood pressure, and oxygen saturationweare monitored continuously during the procedure. The period of conscious sedation was 35 minutes, of which I was present face-to-face 100% of this time.  The transesophageal probe was inserted in the esophagus and stomach without difficulty and multiple views were obtained.  The patient was kept under observation until the patient left the procedure room.  The patient left the procedure room in stable condition.   Agitated microbubble saline contrast was administered.  COMPLICATIONS:    There were no immediate complications.  FINDINGS:  Normal biventricular function, degenerative strands on mitral valve. Mild-moderate aortic atherosclerosis in aortic arch.  Redundant atrial septum without definite evidence of PFO.  FORMAL REPORT TO FOLLOW.  RECOMMENDATIONS:    Can return to hospital bed when alert.  Aggressive secondary prevention of aortic atherosclerosis.   Time Spent Directly with the Patient:  60 minutes   Elouise Munroe 09/05/2019, 9:12 AM

## 2019-09-05 NOTE — Transfer of Care (Signed)
Immediate Anesthesia Transfer of Care Note  Patient: Autumn Haney  Procedure(s) Performed: TRANSESOPHAGEAL ECHOCARDIOGRAM (TEE) (N/A ) BUBBLE STUDY  Patient Location: Endoscopy Unit  Anesthesia Type:MAC  Level of Consciousness: awake, drowsy and patient cooperative  Airway & Oxygen Therapy: Patient Spontanous Breathing and Patient connected to nasal cannula oxygen  Post-op Assessment: Report given to RN and Post -op Vital signs reviewed and stable  Post vital signs: Reviewed and stable  Last Vitals:  Vitals Value Taken Time  BP 120/82 09/05/19 0940  Temp 36.5 C 09/05/19 0940  Pulse 69 09/05/19 0940  Resp 18 09/05/19 0940  SpO2 100 % 09/05/19 0940    Last Pain:  Vitals:   09/05/19 0940  TempSrc: Oral  PainSc:          Complications: No apparent anesthesia complications

## 2019-09-05 NOTE — Discharge Summary (Signed)
Discharge Summary  Autumn Haney WLN:[id number] DOB: Jun 17, 1980  PCP: Patient, No Pcp Per  Admit date: 09/02/2019 Discharge date: 09/05/2019  Time spent: 35 minutes  Recommendations for Outpatient Follow-up:  1. Follow up with neurology 2. Take your medications as prescribed   Discharge Diagnoses:  Active Hospital Problems   Diagnosis Date Noted  . 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 Thrombocytosis ([place]) 09/03/2019  . Microcytic anemia 09/03/2019    Resolved Hospital Problems  No resolved problems to display.    Discharge Condition: Stable   Diet recommendation: Resume previous diet  Vitals:   09/05/19 0940 09/05/19 1206  BP: 120/82 121/78  Pulse: 69 86  Resp: 18 16  Temp: 97.7 F (36.5 C) 98.2 F (36.8 C)  SpO2: 100% 98%    History of present illness:  39 year old female with past medical history of migraines, [facility] after presenting with slurred speech and vertigo.  Per the patient, on 09/02/19 around 9:30 AM she began to experience sudden onset of intense vertigo associated with slurring of her speech. Also noted significant difficulty in attempting to ambulate due to loss of balance, moderate headache, throbbing in quality located in the frontal region that radiated toward the occipital region.  She called her husband who witnessed the majority of the symptoms. Symptoms persist until approximately 6 PM on 09/02/19 when she decided to present to [place] ED for further evaluation.   CT angiogram of the head and neck showed a small area of acute subacute ischemia in the right cerebellum. Case was discussed with Dr. Lorraine Lax, on-call stroke attending, who recommended transfer to [facility] for further stroke work-up.  TR[facility] asked to admit.  Of note, patient symptoms completely resolved and patient returned to baseline close  to midnight.  Seen by neurology/stroke team.  Stroke work up was unrevealing for the embolic source of her acute/subacute CVA.  09/05/19:  No acute events overnight.  Discussed with Dr. Erlinda Hong.  Patient will follow up with neurology outpatient for possible LP.  Plavix 75 mg daily added to asa 81 mg daily for 3 weeks then asa 81 mg alone, per neurology recommendations.   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 is a 39 y.o. female with history of migraines presenting with dizziness, slurred speech and gait imbalance.   Stroke:   Embolic shower at posterior circulation, infarcts embolic secondary to unknown source CT head Bilateral cerebellar infarct  CTA head & neck Unremarkable for any large vessel occlusion or significant atherosclerosis. MRI  Scattered B cerebellar, brainstem and L PCA territory infarcts LE Doppler  Neg DVT 2D Echo LVEF 60-65%. No PFO or source of embolism TEE to look for embolic source. Arranged with [place] for tomorrow at 0800. Negative. If above work up unrevealing, may consider LP, but very unlikely for CNS vasculitis given no clinical sign and CTA unremarkable. May possibly be completed outpatient. LDL 169, goal LDL < 70 HgbA1c 5.6, goal A1C < 7.0, at goal. ESR 7 and CRP 0.5 UDS neg Hypercoagulable labs pending  No antithrombotic prior to admission, now on aspirin 81 mg daily and plavix 75 mg daily x 3 weeks then ASA 81 mg alone.  Therapy recommendations:  None Speech  recommendations: None Follow up with neurology outpatient.  Hyperlipidemia LDL 169, goal less than 70 Continue high intensity statin, Lipitor 80 mg daily  Thrombocytosis Platelet count >500 Continue DAPT, on aspirin 81 mg daily and plavix 75 mg daily x 3 weeks then ASA 81 mg alone.  Iron deficiency anemia Hemoglobin 9.6 with  MCV 75 Presented with hemoglobin of 10.6 Iron studies suggestive of iron deficiency Continue ferrous sulfate 325 mg daily Follow up with your PCP  Hypokalemia K+ 3.3, Magnesium 1.8 on 09/05/19 Repleted with po Kcl 40 meq Continue Kcl 40 meq daily x 3 days Follow up with your PCP.       Code Status: Full code      Consultants:  Neurology  Cardiology for TEE  Procedures:  None  Antimicrobials:  None    Discharge Exam: BP 121/78 (BP Location: Left Arm)   Pulse 86   Temp 98.2 F (36.8 C) (Oral)   Resp 16   Ht '5\' 4"'$  (1.626 m)   Wt 61.2 kg   LMP 08/03/2019 (Approximate)   SpO2 98%   BMI 23.17 kg/m  . General: 39 y.o. year-old female well developed well nourished in no acute distress.  Alert and oriented x3. . Cardiovascular: Regular rate and rhythm with no rubs or gallops.  No thyromegaly or JVD noted.   Marland Kitchen Respiratory: Clear to auscultation with no wheezes or rales. Good inspiratory effort. . Abdomen: Soft nontender nondistended with normal bowel sounds x4 quadrants. . Musculoskeletal: No lower extremity edema. 2/4 pulses in all 4 extremities. . Skin: No ulcerative lesions noted or rashes, . Psychiatry: Mood is appropriate for condition and setting  Discharge Instructions You were cared for by a hospitalist during your hospital stay. If you have any questions about your discharge medications or the care you received while you were in the hospital after you are discharged, you can call the unit and asked to speak with the hospitalist on call if the hospitalist that took care of you is not available. Once you are discharged, your primary care physician will handle any further medical issues. Please note that NO REFILLS for any discharge medications will be authorized once you are discharged, as it is imperative that you return to your primary care physician (or establish a relationship with a primary care physician if you do not have one) for your  aftercare needs so that they can reassess your need for medications and monitor your lab values.  Discharge Instructions    Ambulatory referral to Neurology   Complete by: As directed    Follow up in stroke clinic at [facility] in about 4 weeks with Dr. Antony Contras, Dr. Bess Harvest, or Dr. Sarina Ill.   Stroke in young. MD recommended.     Allergies as of 09/05/2019   No Known Allergies     Medication List    TAKE these medications   aspirin 81 MG EC tablet Take 1 tablet (81 mg total) by mouth daily. Start taking on: Sep 06, 2019   atorvastatin 80 MG tablet Commonly known as: LIPITOR Take 1 tablet (80 mg total) by mouth daily. Start taking on: Sep 06, 2019   clopidogrel 75 MG tablet Commonly known as: PLAVIX Take 1 tablet (75 mg total) by mouth daily for 21 days.   ferrous sulfate 325 (65 FE) MG tablet Take 1 tablet (325 mg total) by mouth daily with breakfast.   naproxen sodium 220 MG tablet Commonly known as: ALEVE Take 440 mg by  mouth 2 (two) times daily as needed (headache).   potassium chloride SA 20 MEQ tablet Commonly known as: KLOR-CON Take 2 tablets (40 mEq total) by mouth daily for 3 days.      No Known Allergies Follow-up Information    [facility] Follow up in 4 week(s).   Specialty: Neurology Why: stroke clinic. office will call with appt date and time.  Contact information: [address] [place] [place]. Call in 1 day(s).   Why: Please call for a post hospital follow up appointment Contact information: [address] [place] [address] [phone]           The results of significant diagnostics from this hospitalization (including imaging, microbiology, ancillary and laboratory) are listed below for reference.    Significant Diagnostic Studies: CT Angio Head W or Wo  Contrast  Result Date: 09/02/2019 CLINICAL DATA:  Headache with neck pain and dizziness. EXAM: CT ANGIOGRAPHY HEAD AND NECK TECHNIQUE: Multidetector CT imaging of the head and neck was performed using the standard protocol during bolus administration of intravenous contrast. Multiplanar CT image reconstructions and MIPs were obtained to evaluate the vascular anatomy. Carotid stenosis measurements (when applicable) are obtained utilizing NASCET criteria, using the distal internal carotid diameter as the denominator. CONTRAST:  146m OMNIPAQUE IOHEXOL 350 MG/ML SOLN COMPARISON:  None. FINDINGS: CT HEAD FINDINGS Brain: There is no mass, hemorrhage or extra-axial collection. The size and configuration of the ventricles and extra-axial CSF spaces are normal. There is a small area of acute to subacute ischemia within the right cerebellum. Brain parenchyma is otherwise normal. Skull: The visualized skull base, calvarium and extracranial soft tissues are normal. Sinuses/Orbits: No fluid levels or advanced mucosal thickening of the visualized paranasal sinuses. No mastoid or middle ear effusion. The orbits are normal. CTA NECK FINDINGS SKELETON: There is no bony spinal canal stenosis. No lytic or blastic lesion. OTHER NECK: Normal pharynx, larynx and major salivary glands. No cervical lymphadenopathy. Unremarkable thyroid gland. UPPER CHEST: No pneumothorax or pleural effusion. No nodules or masses. AORTIC ARCH: There is no calcific atherosclerosis of the aortic arch. There is no aneurysm, dissection or hemodynamically significant stenosis of the visualized portion of the aorta. Conventional 3 vessel aortic branching pattern. The visualized proximal subclavian arteries are widely patent. RIGHT CAROTID SYSTEM: Normal without aneurysm, dissection or stenosis. LEFT CAROTID SYSTEM: Normal without aneurysm, dissection or stenosis. VERTEBRAL ARTERIES: Left dominant configuration. Both origins are clearly patent. There is no  dissection, occlusion or flow-limiting stenosis to the skull base (V1-V3 segments). CTA HEAD FINDINGS POSTERIOR CIRCULATION: --Vertebral arteries: Normal V4 segments. --Inferior cerebellar arteries: Normal. --Basilar artery: Normal. --Superior cerebellar arteries: Normal. --Posterior cerebral arteries (PCA): Normal. The right PCA is partially supplied by a posterior communicating artery (p-comm). ANTERIOR CIRCULATION: --Intracranial internal carotid arteries: Normal. --Anterior cerebral arteries (ACA): Normal. Both A1 segments are present. Patent anterior communicating artery (a-comm). --Middle cerebral arteries (MCA): Normal. VENOUS SINUSES: As permitted by contrast timing, patent. ANATOMIC VARIANTS: None Review of the MIP images confirms the above findings. IMPRESSION: 1. Small area of acute to subacute ischemia within the right cerebellum. MRI recommended for further evaluation. 2. Normal CTA of the head and neck.  No cerebellar artery occlusion. Electronically Signed   By: KUlyses JarredM.D.   On: 09/02/2019 23:34   CT Angio Neck W and/or Wo Contrast  Result Date: 09/02/2019 CLINICAL DATA:  Headache with  neck pain and dizziness. EXAM: CT ANGIOGRAPHY HEAD AND NECK TECHNIQUE: Multidetector CT imaging of the head and neck was performed using the standard protocol during bolus administration of intravenous contrast. Multiplanar CT image reconstructions and MIPs were obtained to evaluate the vascular anatomy. Carotid stenosis measurements (when applicable) are obtained utilizing NASCET criteria, using the distal internal carotid diameter as the denominator. CONTRAST:  149m OMNIPAQUE IOHEXOL 350 MG/ML SOLN COMPARISON:  None. FINDINGS: CT HEAD FINDINGS Brain: There is no mass, hemorrhage or extra-axial collection. The size and configuration of the ventricles and extra-axial CSF spaces are normal. There is a small area of acute to subacute ischemia within the right cerebellum. Brain parenchyma is otherwise normal.  Skull: The visualized skull base, calvarium and extracranial soft tissues are normal. Sinuses/Orbits: No fluid levels or advanced mucosal thickening of the visualized paranasal sinuses. No mastoid or middle ear effusion. The orbits are normal. CTA NECK FINDINGS SKELETON: There is no bony spinal canal stenosis. No lytic or blastic lesion. OTHER NECK: Normal pharynx, larynx and major salivary glands. No cervical lymphadenopathy. Unremarkable thyroid gland. UPPER CHEST: No pneumothorax or pleural effusion. No nodules or masses. AORTIC ARCH: There is no calcific atherosclerosis of the aortic arch. There is no aneurysm, dissection or hemodynamically significant stenosis of the visualized portion of the aorta. Conventional 3 vessel aortic branching pattern. The visualized proximal subclavian arteries are widely patent. RIGHT CAROTID SYSTEM: Normal without aneurysm, dissection or stenosis. LEFT CAROTID SYSTEM: Normal without aneurysm, dissection or stenosis. VERTEBRAL ARTERIES: Left dominant configuration. Both origins are clearly patent. There is no dissection, occlusion or flow-limiting stenosis to the skull base (V1-V3 segments). CTA HEAD FINDINGS POSTERIOR CIRCULATION: --Vertebral arteries: Normal V4 segments. --Inferior cerebellar arteries: Normal. --Basilar artery: Normal. --Superior cerebellar arteries: Normal. --Posterior cerebral arteries (PCA): Normal. The right PCA is partially supplied by a posterior communicating artery (p-comm). ANTERIOR CIRCULATION: --Intracranial internal carotid arteries: Normal. --Anterior cerebral arteries (ACA): Normal. Both A1 segments are present. Patent anterior communicating artery (a-comm). --Middle cerebral arteries (MCA): Normal. VENOUS SINUSES: As permitted by contrast timing, patent. ANATOMIC VARIANTS: None Review of the MIP images confirms the above findings. IMPRESSION: 1. Small area of acute to subacute ischemia within the right cerebellum. MRI recommended for further  evaluation. 2. Normal CTA of the head and neck.  No cerebellar artery occlusion. Electronically Signed   By: KUlyses JarredM.D.   On: 09/02/2019 23:34   MR BRAIN WO CONTRAST  Result Date: 09/04/2019 CLINICAL DATA:  Sudden onset dizziness with slurred speech and gait disturbance EXAM: MRI HEAD WITHOUT CONTRAST TECHNIQUE: Multiplanar, multiecho pulse sequences of the brain and surrounding structures were obtained without intravenous contrast. COMPARISON:  CTA head neck 09/02/2019 FINDINGS: Brain: There are numerous foci of abnormal diffusion restriction throughout both cerebellar hemispheres and within the brainstem. There are a few scattered foci within the left PCA territory, including the left thalamus and occipital lobe. Cytotoxic edema at the infarct sites. No mass effect. Normal volume of CSF spaces. No chronic microhemorrhage. Normal midline structures. Vascular: Normal flow voids. Skull and upper cervical spine: Normal marrow signal. Sinuses/Orbits: Negative. Other: None. IMPRESSION: 1. Numerous foci of acute to early subacute ischemia throughout both cerebellar hemispheres, the brainstem and, to a lesser extent, the left PCA territory. 2. No mass effect or hemorrhage. Electronically Signed   By: KUlyses JarredM.D.   On: 09/04/2019 03:54   ECHO TEE  Result Date: 09/05/2019    TRANSESOPHOGEAL ECHO REPORT   Patient Name:   DDEMAYA HARDGE  Date of Exam: 09/05/2019 Medical Rec #:  [id number]       Height:       64.0 in Accession #:    [id number]      Weight:       135.0 lb Date of Birth:  [birth year]       BSA:          1.655 m Patient Age:    4 years        BP:           140/102 mmHg Patient Gender: F               HR:           86 bpm. Exam Location:  Inpatient Procedure: Transesophageal Echo, Color Doppler and Cardiac Doppler Indications:     stroke  History:         Patient has prior history of Echocardiogram examinations, most                  recent 09/04/2019.  Sonographer:     Johny Chess  Referring Phys:  [id number] [place] Diagnosing Phys: Cherlynn Kaiser MD PROCEDURE: After discussion of the risks and benefits of a TEE, an informed consent was obtained from the patient. TEE procedure time was 29 minutes. The transesophogeal probe was passed without difficulty through the esophogus of the patient. Imaged were obtained with the patient in a left lateral decubitus position. Local oropharyngeal anesthetic was provided with Cetacaine. Sedation performed by different physician. Image quality was excellent. The patient's vital signs; including heart rate, blood pressure, and oxygen saturation; remained stable throughout the procedure. The patient developed no complications during the procedure. IMPRESSIONS  1. Left ventricular ejection fraction, by estimation, is 60 to 65%. The left ventricle has normal function. The left ventricle has no regional wall motion abnormalities.  2. Right ventricular systolic function is normal. The right ventricular size is normal.  3. No left atrial/left atrial appendage thrombus was detected. The LAA emptying velocity was 73 cm/s.  4. The mitral valve is grossly normal. Trivial mitral valve regurgitation.  5. The aortic valve is tricuspid. Aortic valve regurgitation is not visualized. No aortic stenosis is present.  6. There is Mild-moderate (Grade II-III) atheroma plaque involving the aortic arch.  7. Agitated saline contrast bubble study was negative, with no evidence of any interatrial shunt. The atrial septum is redundant with possible tiny amount of color flow seen between the fossa ovalis flap. No definite atrial level shunt by color flow Doppler. FINDINGS  Left Ventricle: Left ventricular ejection fraction, by estimation, is 60 to 65%. The left ventricle has normal function. The left ventricle has no regional wall motion abnormalities. The left ventricular internal cavity size was normal in size. There is  no left ventricular hypertrophy. Right Ventricle: The  right ventricular size is normal. No increase in right ventricular wall thickness. Right ventricular systolic function is normal. Left Atrium: Left atrial size was normal in size. No left atrial/left atrial appendage thrombus was detected. The LAA emptying velocity was 73 cm/s. Right Atrium: Right atrial size was normal in size. Pericardium: There is no evidence of pericardial effusion. Mitral Valve: Degenerative strands on atrial surface of valve. The mitral valve is grossly normal. Trivial mitral valve regurgitation. Tricuspid Valve: The tricuspid valve is normal in structure. Tricuspid valve regurgitation is trivial. Aortic Valve: The aortic valve is tricuspid. Aortic valve regurgitation is not visualized. No aortic stenosis is present. Pulmonic Valve: The pulmonic  valve was normal in structure. Pulmonic valve regurgitation is trivial. Aorta: The aortic root and ascending aorta are structurally normal, with no evidence of dilitation. There is mild-moderate (Grade II-III) atheroma plaque involving the transverse aorta. IAS/Shunts: There is redundancy of the interatrial septum. No atrial level shunt detected by color flow Doppler. Agitated saline contrast was given intravenously to evaluate for intracardiac shunting. Agitated saline contrast bubble study was negative, with no evidence of any interatrial shunt.   AORTA Ao Root diam: 2.80 cm Ao Asc diam:  2.80 cm Cherlynn Kaiser MD Electronically signed by Cherlynn Kaiser MD Signature Date/Time: 09/05/2019/10:40:07 AM    Final    ECHOCARDIOGRAM COMPLETE BUBBLE STUDY  Result Date: 09/04/2019    ECHOCARDIOGRAM REPORT   Patient Name:   HINA GUPTA Date of Exam: 09/04/2019 Medical Rec #:  [id number]       Height:       64.0 in Accession #:    [id number]      Weight:       135.0 lb Date of Birth:  Dec 14, 1980       BSA:          1.655 m Patient Age:    57 years        BP:           129/87 mmHg Patient Gender: F               HR:           71 bpm. Exam Location:   Inpatient Procedure: 2D Echo, Cardiac Doppler, Color Doppler, Strain Analysis, Saline            Contrast Bubble Study and 3D Echo Indications:   Stroke  History:       Patient has no prior history of Echocardiogram examinations.                Stroke.  Sonographer:   Roseanna Rainbow RDCS Referring      [id number] [place] Phys: IMPRESSIONS  1. Left ventricular ejection fraction, by estimation, is 60 to 65%. The left ventricle has normal function. The left ventricle has no regional wall motion abnormalities. Left ventricular diastolic parameters were normal.  2. Right ventricular systolic function is normal. The right ventricular size is normal.  3. The mitral valve is normal in structure. No evidence of mitral valve regurgitation. No evidence of mitral stenosis.  4. The aortic valve is bicuspid. Aortic valve regurgitation is not visualized. No aortic stenosis is present.  5. The inferior vena cava is dilated in size with >50% respiratory variability, suggesting right atrial pressure of 8 mmHg.  6. Agitated saline contrast bubble study was negative, with no evidence of any interatrial shunt. Comparison(s): No prior Echocardiogram. Conclusion(s)/Recommendation(s): No intracardiac source of embolism detected on this transthoracic study. A transesophageal echocardiogram is recommended to exclude cardiac source of embolism if clinically indicated. FINDINGS  Left Ventricle: Left ventricular ejection fraction, by estimation, is 60 to 65%. The left ventricle has normal function. The left ventricle has no regional wall motion abnormalities. The left ventricular internal cavity size was normal in size. There is  no left ventricular hypertrophy. Left ventricular diastolic parameters were normal. Right Ventricle: The right ventricular size is normal. No increase in right ventricular wall thickness. Right ventricular systolic function is normal. Left Atrium: Left atrial size was normal in size. Right Atrium: Right atrial size  was normal in size. Pericardium: There is no evidence of pericardial effusion. Mitral Valve: The mitral  valve is normal in structure. No evidence of mitral valve regurgitation. No evidence of mitral valve stenosis. Tricuspid Valve: The tricuspid valve is normal in structure. Tricuspid valve regurgitation is trivial. No evidence of tricuspid stenosis. Aortic Valve: The aortic valve is bicuspid. Aortic valve regurgitation is not visualized. No aortic stenosis is present. Pulmonic Valve: The pulmonic valve was not well visualized. Pulmonic valve regurgitation is not visualized. No evidence of pulmonic stenosis. Aorta: The aortic root, ascending aorta and aortic arch are all structurally normal, with no evidence of dilitation or obstruction. Venous: The inferior vena cava is dilated in size with greater than 50% respiratory variability, suggesting right atrial pressure of 8 mmHg. IAS/Shunts: No atrial level shunt detected by color flow Doppler. Agitated saline contrast was given intravenously to evaluate for intracardiac shunting. Agitated saline contrast bubble study was negative, with no evidence of any interatrial shunt.  LEFT VENTRICLE PLAX 2D LVIDd:         3.91 cm     Diastology LVIDs:         2.51 cm     LV e' lateral:   20.60 cm/s LV PW:         1.05 cm     LV E/e' lateral: 3.8 LV IVS:        1.00 cm     LV e' medial:    10.70 cm/s LVOT diam:     1.70 cm     LV E/e' medial:  7.3 LV SV:         49 LV SV Index:   30          2D Longitudinal Strain LVOT Area:     2.27 cm    2D Strain GLS (A2C):   -24.9 %                            2D Strain GLS (A3C):   -26.9 %                            2D Strain GLS (A4C):   -23.5 % LV Volumes (MOD)           2D Strain GLS Avg:     -25.1 % LV vol d, MOD A2C: 54.7 ml LV vol d, MOD A4C: 60.7 ml LV vol s, MOD A2C: 20.6 ml LV vol s, MOD A4C: 19.2 ml 3D Volume EF: LV SV MOD A2C:     34.1 ml 3D EF:        65 % LV SV MOD A4C:     60.7 ml LV EDV:       139 ml LV SV MOD BP:      36.9 ml  LV ESV:       49 ml                            LV SV:        90 ml RIGHT VENTRICLE            IVC RV S prime:     9.83 cm/s  IVC diam: 2.12 cm TAPSE (M-mode): 1.8 cm LEFT ATRIUM             Index       RIGHT ATRIUM          Index LA diam:  2.50 cm 1.51 cm/m  RA Area:     9.85 cm LA Vol (A2C):   37.0 ml 22.35 ml/m RA Volume:   21.10 ml 12.75 ml/m LA Vol (A4C):   19.6 ml 11.84 ml/m LA Biplane Vol: 29.8 ml 18.00 ml/m  AORTIC VALVE LVOT Vmax:   119.00 cm/s LVOT Vmean:  77.200 cm/s LVOT VTI:    0.216 m  AORTA Ao Root diam: 3.00 cm Ao Asc diam:  2.80 cm MITRAL VALVE MV Area (PHT): 3.60 cm    SHUNTS MV Decel Time: 211 msec    Systemic VTI:  0.22 m MV E velocity: 78.40 cm/s  Systemic Diam: 1.70 cm MV A velocity: 72.40 cm/s MV E/A ratio:  1.08 Buford Dresser MD Electronically signed by Buford Dresser MD Signature Date/Time: 09/04/2019/1:45:47 PM    Final    VAS [place] LOWER EXTREMITY VENOUS (DVT)  Result Date: 09/04/2019  Lower Venous DVTStudy Indications: Stroke.  Risk Factors: None identified. Comparison Study: No prior studies. Performing Technologist: Oliver Hum RVT  Examination Guidelines: A complete evaluation includes B-mode imaging, spectral Doppler, color Doppler, and power Doppler as needed of all accessible portions of each vessel. Bilateral testing is considered an integral part of a complete examination. Limited examinations for reoccurring indications may be performed as noted. The reflux portion of the exam is performed with the patient in reverse Trendelenburg.  +---------+---------------+---------+-----------+----------+--------------+ RIGHT    CompressibilityPhasicitySpontaneityPropertiesThrombus Aging +---------+---------------+---------+-----------+----------+--------------+ CFV      Full           Yes      Yes                                 +---------+---------------+---------+-----------+----------+--------------+ SFJ      Full                                                         +---------+---------------+---------+-----------+----------+--------------+ FV Prox  Full                                                        +---------+---------------+---------+-----------+----------+--------------+ FV Mid   Full                                                        +---------+---------------+---------+-----------+----------+--------------+ FV DistalFull                                                        +---------+---------------+---------+-----------+----------+--------------+ PFV      Full                                                        +---------+---------------+---------+-----------+----------+--------------+  POP      Full           Yes      Yes                                 +---------+---------------+---------+-----------+----------+--------------+ PTV      Full                                                        +---------+---------------+---------+-----------+----------+--------------+ PERO     Full                                                        +---------+---------------+---------+-----------+----------+--------------+   +---------+---------------+---------+-----------+----------+--------------+ LEFT     CompressibilityPhasicitySpontaneityPropertiesThrombus Aging +---------+---------------+---------+-----------+----------+--------------+ CFV      Full           Yes      Yes                                 +---------+---------------+---------+-----------+----------+--------------+ SFJ      Full                                                        +---------+---------------+---------+-----------+----------+--------------+ FV Prox  Full                                                        +---------+---------------+---------+-----------+----------+--------------+ FV Mid   Full                                                         +---------+---------------+---------+-----------+----------+--------------+ FV DistalFull                                                        +---------+---------------+---------+-----------+----------+--------------+ PFV      Full                                                        +---------+---------------+---------+-----------+----------+--------------+ POP      Full           Yes      Yes                                 +---------+---------------+---------+-----------+----------+--------------+  PTV      Full                                                        +---------+---------------+---------+-----------+----------+--------------+ PERO     Full                                                        +---------+---------------+---------+-----------+----------+--------------+     Summary: RIGHT: - There is no evidence of deep vein thrombosis in the lower extremity.  - No cystic structure found in the popliteal fossa.  LEFT: - There is no evidence of deep vein thrombosis in the lower extremity.  - No cystic structure found in the popliteal fossa.  *See table(s) above for measurements and observations. Electronically signed by Monica Martinez MD on 09/04/2019 at 5:34:55 PM.    Final     Microbiology: Recent Results (from the past 240 hour(s))  SARS Coronavirus 2 by RT PCR (hospital order, performed in [facility] hospital lab) Nasopharyngeal Nasopharyngeal Swab     Status: None   Collection Time: 09/03/19 12:32 AM   Specimen: Nasopharyngeal Swab  Result Value Ref Range Status   SARS Coronavirus 2 NEGATIVE NEGATIVE Final    Comment: (NOTE) SARS-CoV-2 target nucleic acids are NOT DETECTED. The SARS-CoV-2 RNA is generally detectable in upper and lower respiratory specimens during the acute phase of infection. The lowest concentration of SARS-CoV-2 viral copies this assay can detect is 250 copies / mL. A negative result does not preclude SARS-CoV-2  infection and should not be used as the sole basis for treatment or other patient management decisions.  A negative result may occur with improper specimen collection / handling, submission of specimen other than nasopharyngeal swab, presence of viral mutation(s) within the areas targeted by this assay, and inadequate number of viral copies (<250 copies / mL). A negative result must be combined with clinical observations, patient history, and epidemiological information. Fact Sheet for Patients:   StrictlyIdeas.no Fact Sheet for Healthcare Providers: BankingDealers.co.za This test is not yet approved or cleared  by the [place] FDA and has been authorized for detection and/or diagnosis of SARS-CoV-2 by FDA under an Emergency Use Authorization (EUA).  This EUA will remain in effect (meaning this test can be used) for the duration of the COVID-19 declaration under Section 564(b)(1) of the Act, 21 U.S.C. section 360bbb-3(b)(1), unless the authorization is terminated or revoked sooner. Performed at [facility], [address] [place], [place] [address]      Labs: Basic Metabolic Panel: Recent Labs  Lab 09/02/19 2122 09/05/19 0231  NA 135 138  K 3.9 3.3*  CL 102 102  CO2 23 26  GLUCOSE 99 91  BUN 16 15  CREATININE 0.70 0.73  CALCIUM 9.0 9.0  MG  --  1.8  PHOS  --  5.2*   Liver Function Tests: No results for input(s): AST, ALT, ALKPHOS, BILITOT, PROT, ALBUMIN in the last 168 hours. No results for input(s): LIPASE, AMYLASE in the last 168 hours. No results for input(s): AMMONIA in the last 168 hours. CBC: Recent Labs  Lab 09/02/19 2122 09/03/19 2025 09/05/19 0231  WBC 8.1 5.8 6.5  NEUTROABS  --  3.2  --   HGB 10.6* 9.6* 9.4*  HCT 33.9* 30.6* 31.4*  MCV 75.0* 75.0* 76.4*  PLT 581* 512* 511*   Cardiac Enzymes: No results for input(s): CKTOTAL, CKMB, CKMBINDEX, TROPONINI in the last 168 hours. BNP: BNP  (last 3 results) No results for input(s): BNP in the last 8760 hours.  ProBNP (last 3 results) No results for input(s): PROBNP in the last 8760 hours.  CBG: Recent Labs  Lab 09/02/19 2215 09/02/19 2329 09/03/19 0340 09/03/19 0643 09/03/19 0859  GLUCAP 69* 104* 101* 115* 115*       Signed:  Kayleen Memos, MD [facility] 09/05/2019, 2:53 PM

## 2019-09-05 NOTE — Interval H&P Note (Signed)
History and Physical Interval Note:  09/05/2019 8:09 AM  Autumn Haney  has presented today for surgery, with the diagnosis of STROKE.  The various methods of treatment have been discussed with the patient and family. After consideration of risks, benefits and other options for treatment, the patient has consented to  Procedure(s): TRANSESOPHAGEAL ECHOCARDIOGRAM (TEE) (N/A) as a surgical intervention.  The patient's history has been reviewed, patient examined, no change in status, stable for surgery.  I have reviewed the patient's chart and labs.  Questions were answered to the patient's satisfaction.     Elouise Munroe

## 2019-09-05 NOTE — Plan of Care (Signed)
Adequate for discharge.

## 2019-09-06 LAB — PROTEIN C, TOTAL: Protein C, Total: 137 % (ref 60–150)

## 2019-09-07 LAB — FACTOR 5 LEIDEN

## 2019-09-08 LAB — PROTHROMBIN GENE MUTATION

## 2019-10-18 ENCOUNTER — Encounter: Payer: Self-pay | Admitting: Neurology

## 2019-10-18 ENCOUNTER — Telehealth: Payer: Self-pay

## 2019-10-18 ENCOUNTER — Other Ambulatory Visit: Payer: Self-pay

## 2019-10-18 ENCOUNTER — Ambulatory Visit: Payer: BC Managed Care – PPO | Admitting: Neurology

## 2019-10-18 VITALS — Ht 64.0 in | Wt 134.0 lb

## 2019-10-18 DIAGNOSIS — I639 Cerebral infarction, unspecified: Secondary | ICD-10-CM | POA: Diagnosis not present

## 2019-10-18 NOTE — Progress Notes (Signed)
[facility] [address] [place]. [place] [address] [phone]       OFFICE CONSULT NOTE  [name] Date of Birth:  [birth year] Medical Record Number:  [id number]   Referring MD:  Rosalin Hawking  Reason for Referral:  stroke  HPI: [name] is a 39 year old Caucasian lady seen today for initial office consultation visit for stroke.  She is accompanied by her husband.  History is obtained from them and review of electronic medical records and I personally reviewed imaging films in PACS.  She has no significant past medical history except rare migraines who presented to [facility] on 45/15[facility]21 with sudden onset of dizziness lightheadedness and vertigo.  She had to lie down and that she try to get up she required help to walk as a balance was off.  Her speech was also slurred and vision was blurred.  When her symptoms persisted she eventually came to the hospital.  She presented beyond time window for TPA.  CT scan of the head showed right cerebral infarct and CT angiogram was negative for any large vessel stenosis, dissection or occlusion.  She was admitted for further work-up and MRI scan next day showed multiple small infarcts in bilateral cerebellum, brainstem, left occipital lobe and left medial thalamus.  Work-up showed slight thrombocytosis with elevated platelet count of 511,000 but she had chronic iron deficiency anemia which could account for this.  Extensive work-up including lower extremity venous Dopplers, 2D echo, transesophageal echocardiogram, transcranial Doppler bubble studies were negative.  Lab work for hypercoagulable state was all negative.  Vasculitic labs were also negative.  Patient had history of 2 miscarriages but antiphospholipid antibody test was negative.  Patient symptoms resolved quite significantly within 24 hours she was back to baseline.  She is started on aspirin and Plavix which she took for 3 weeks without complications and is  now stopped Plavix and is on aspirin alone.  Her LDL cholesterol is elevated 169 for which she was started on Lipitor 80 mg which is tolerating well without side effects.  Hemoglobin A1c was 5.6.  Patient has had no recurrent stroke or TIA symptoms.  She is returned back to work full-time without any limitations and in fact has started exercising without any issues either.  She has no prior history of strokes TIAs.  She does have history of migraines but these are infrequent and stopped after her last child 9 years ago.  There is no family history of strokes or heart attacks at a young age.  Patient has no prior history of deep vein thrombosis or pulmonary embolism.  ROS:   14 system review of systems is positive for dizziness, vertigo, ataxia, slurred speech, lightheadedness, headache and all other systems negative  PMH:  Past Medical History:  Diagnosis Date  . Headache    rare migraines  . Hx of varicella   . Normal pregnancy 10/25/2010    Social History:  Social History   Socioeconomic History  . Marital status: Married    Spouse name: Not on file  . Number of children: Not on file  . Years of education: Not on file  . Highest education level: Not on file  Occupational History  . Not on file  Tobacco Use  . Smoking status: Never Smoker  . Smokeless tobacco: Never Used  Substance and Sexual Activity  . Alcohol use: Yes    Alcohol/week: 2.0 standard drinks    Types: 1 Glasses of wine, 1 Cans of  beer per week    Comment: occasionally  . Drug use: No  . Sexual activity: Yes  Other Topics Concern  . Not on file  Social History Narrative  . Not on file   Social Determinants of Health   Financial Resource Strain:   . Difficulty of Paying Living Expenses:   Food Insecurity:   . Worried About Charity fundraiser in the Last Year:   . Arboriculturist in the Last Year:   Transportation Needs:   . Film/video editor (Medical):   Marland Kitchen Lack of Transportation (Non-Medical):     Physical Activity:   . Days of Exercise per Week:   . Minutes of Exercise per Session:   Stress:   . Feeling of Stress :   Social Connections:   . Frequency of Communication with Friends and Family:   . Frequency of Social Gatherings with Friends and Family:   . Attends Religious Services:   . Active Member of Clubs or Organizations:   . Attends Archivist Meetings:   Marland Kitchen Marital Status:   Intimate Partner Violence:   . Fear of Current or Ex-Partner:   . Emotionally Abused:   Marland Kitchen Physically Abused:   . Sexually Abused:     Medications:   Current Outpatient Medications on File Prior to Visit  Medication Sig Dispense Refill  . aspirin 81 MG EC tablet Take 1 tablet (81 mg total) by mouth daily. 360 tablet 0  . atorvastatin (LIPITOR) 80 MG tablet Take 1 tablet (80 mg total) by mouth daily. 90 tablet 0  . ferrous sulfate 325 (65 FE) MG tablet Take 1 tablet (325 mg total) by mouth daily with breakfast. 90 tablet 0  . naproxen sodium (ALEVE) 220 MG tablet Take 440 mg by mouth 2 (two) times daily as needed (headache).     No current facility-administered medications on file prior to visit.    Allergies:  No Known Allergies  Physical Exam General: well developed, well nourished young Caucasian lady, seated, in no evident distress Head: head normocephalic and atraumatic.   Neck: supple with no carotid or supraclavicular bruits Cardiovascular: regular rate and rhythm, no murmurs Musculoskeletal: no deformity Skin:  no rash/petichiae Vascular:  Normal pulses all extremities  Neurologic Exam Mental Status: Awake and fully alert. Oriented to place and time. Recent and remote memory intact. Attention span, concentration and fund of knowledge appropriate. Mood and affect appropriate.  Cranial Nerves: Fundoscopic exam reveals sharp disc margins. Pupils equal, briskly reactive to light. Extraocular movements full without nystagmus. Visual fields full to confrontation. Hearing intact.  Facial sensation intact. Face, tongue, palate moves normally and symmetrically.  Motor: Normal bulk and tone. Normal strength in all tested extremity muscles. Sensory.: intact to touch , pinprick , position and vibratory sensation.  Coordination: Rapid alternating movements normal in all extremities. Finger-to-nose and heel-to-shin performed accurately bilaterally. Gait and Station: Arises from chair without difficulty. Stance is normal. Gait demonstrates normal stride length and balance . Able to heel, toe and tandem walk without difficulty.  Reflexes: 1+ and symmetric. Toes downgoing.   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  No significant vascular risk factors except hyperlipidemia.  She has done very well with no residual deficits.    PLAN: I had a long d/w patient about his recent stroke, risk for recurrent stroke/TIAs, personally independently reviewed imaging studies and stroke evaluation  results and answered questions.Continue aspirin 81 mg daily  for secondary stroke prevention and maintain strict control of hypertension with blood pressure goal below 130/90, diabetes with hemoglobin A1c goal below 6.5% and lipids with LDL cholesterol goal below 70 mg/dL. I also advised the patient to eat a healthy diet with plenty of whole grains, cereals, fruits and vegetables, exercise regularly and maintain ideal body weight .  Check follow-up lipid profile, CBC and 30-day cardiac event monitor.  Greater than 50% time during this 45-minute consultation was it was spent on counseling and coordination of care about her cryptogenic stroke and discussion about stroke prevention and answering questions.  Followup in the future with me in 3 months or call earlier if necessary. Antony Contras, MD  [facility] [address] [place], [place] [address]  Phone  [phone] Fax [phone] Note: This document was prepared with digital dictation and possible smart phrase technology. Any transcriptional errors that result from this process are unintentional.

## 2019-10-18 NOTE — Telephone Encounter (Signed)
30 day Event Monitor registered to be mailed to pt's home address. Pt is aware that [employer] will be calling her. Address verified.

## 2019-10-18 NOTE — Patient Instructions (Signed)
I had a long d/w patient about his recent stroke, risk for recurrent stroke/TIAs, personally independently reviewed imaging studies and stroke evaluation results and answered questions.Continue aspirin 81 mg daily  for secondary stroke prevention and maintain strict control of hypertension with blood pressure goal below 130/90, diabetes with hemoglobin A1c goal below 6.5% and lipids with LDL cholesterol goal below 70 mg/dL. I also advised the patient to eat a healthy diet with plenty of whole grains, cereals, fruits and vegetables, exercise regularly and maintain ideal body weight .  Check follow-up lipid profile, CBC and 30-day cardiac event monitor.  Followup in the future with me in 3 months or call earlier if necessary.  Stroke Prevention Some medical conditions and behaviors are associated with a higher chance of having a stroke. You can help prevent a stroke by making nutrition, lifestyle, and other changes, including managing any medical conditions you may have. What nutrition changes can be made?   Eat healthy foods. You can do this by: ? Choosing foods high in fiber, such as fresh fruits and vegetables and whole grains. ? Eating at least 5 or more servings of fruits and vegetables a day. Try to fill half of your plate at each meal with fruits and vegetables. ? Choosing lean protein foods, such as lean cuts of meat, poultry without skin, fish, tofu, beans, and nuts. ? Eating low-fat dairy products. ? Avoiding foods that are high in salt (sodium). This can help lower blood pressure. ? Avoiding foods that have saturated fat, trans fat, and cholesterol. This can help prevent high cholesterol. ? Avoiding processed and premade foods.  Follow your health care provider's specific guidelines for losing weight, controlling high blood pressure (hypertension), lowering high cholesterol, and managing diabetes. These may include: ? Reducing your daily calorie intake. ? Limiting your daily sodium intake  to 1,500 milligrams (mg). ? Using only healthy fats for cooking, such as olive oil, canola oil, or sunflower oil. ? Counting your daily carbohydrate intake. What lifestyle changes can be made?  Maintain a healthy weight. Talk to your health care provider about your ideal weight.  Get at least 30 minutes of moderate physical activity at least 5 days a week. Moderate activity includes brisk walking, biking, and swimming.  Do not use any products that contain nicotine or tobacco, such as cigarettes and e-cigarettes. If you need help quitting, ask your health care provider. It may also be helpful to avoid exposure to secondhand smoke.  Limit alcohol intake to no more than 1 drink a day for nonpregnant women and 2 drinks a day for men. One drink equals 12 oz of beer, 5 oz of wine, or 1 oz of hard liquor.  Stop any illegal drug use.  Avoid taking birth control pills. Talk to your health care provider about the risks of taking birth control pills if: ? You are over 24 years old. ? You smoke. ? You get migraines. ? You have ever had a blood clot. What other changes can be made?  Manage your cholesterol levels. ? Eating a healthy diet is important for preventing high cholesterol. If cholesterol cannot be managed through diet alone, you may also need to take medicines. ? Take any prescribed medicines to control your cholesterol as told by your health care provider.  Manage your diabetes. ? Eating a healthy diet and exercising regularly are important parts of managing your blood sugar. If your blood sugar cannot be managed through diet and exercise, you may need to take medicines. ?  Take any prescribed medicines to control your diabetes as told by your health care provider.  Control your hypertension. ? To reduce your risk of stroke, try to keep your blood pressure below 130/80. ? Eating a healthy diet and exercising regularly are an important part of controlling your blood pressure. If your  blood pressure cannot be managed through diet and exercise, you may need to take medicines. ? Take any prescribed medicines to control hypertension as told by your health care provider. ? Ask your health care provider if you should monitor your blood pressure at home. ? Have your blood pressure checked every year, even if your blood pressure is normal. Blood pressure increases with age and some medical conditions.  Get evaluated for sleep disorders (sleep apnea). Talk to your health care provider about getting a sleep evaluation if you snore a lot or have excessive sleepiness.  Take over-the-counter and prescription medicines only as told by your health care provider. Aspirin or blood thinners (antiplatelets or anticoagulants) may be recommended to reduce your risk of forming blood clots that can lead to stroke.  Make sure that any other medical conditions you have, such as atrial fibrillation or atherosclerosis, are managed. What are the warning signs of a stroke? The warning signs of a stroke can be easily remembered as BEFAST.  B is for balance. Signs include: ? Dizziness. ? Loss of balance or coordination. ? Sudden trouble walking.  E is for eyes. Signs include: ? A sudden change in vision. ? Trouble seeing.  F is for face. Signs include: ? Sudden weakness or numbness of the face. ? The face or eyelid drooping to one side.  A is for arms. Signs include: ? Sudden weakness or numbness of the arm, usually on one side of the body.  S is for speech. Signs include: ? Trouble speaking (aphasia). ? Trouble understanding.  T is for time. ? These symptoms may represent a serious problem that is an emergency. Do not wait to see if the symptoms will go away. Get medical help right away. Call your local emergency services (911 in the [place] Do not drive yourself to the hospital.  Other signs of stroke may include: ? A sudden, severe headache with no known cause. ? Nausea or  vomiting. ? Seizure. Where to find more information For more information, visit:  American Stroke Association: www.strokeassociation.org  National Stroke Association: www.stroke.org Summary  You can prevent a stroke by eating healthy, exercising, not smoking, limiting alcohol intake, and managing any medical conditions you may have.  Do not use any products that contain nicotine or tobacco, such as cigarettes and e-cigarettes. If you need help quitting, ask your health care provider. It may also be helpful to avoid exposure to secondhand smoke.  Remember BEFAST for warning signs of stroke. Get help right away if you or a loved one has any of these signs. This information is not intended to replace advice given to you by your health care provider. Make sure you discuss any questions you have with your health care provider. Document Revised: 03/19/2017 Document Reviewed: 05/12/2016 Elsevier Patient Education  2020 [employer].

## 2019-10-19 LAB — LIPID PANEL
Chol/HDL Ratio: 2.5 ratio (ref 0.0–4.4)
Cholesterol, Total: 183 mg/dL (ref 100–199)
HDL: 72 mg/dL (ref >39)
LDL Chol Calc (NIH): 102 mg/dL — ABNORMAL HIGH (ref 0–99)
Triglycerides: 46 mg/dL (ref 0–149)
VLDL Cholesterol Cal: 9 mg/dL (ref 5–40)

## 2019-10-19 LAB — CBC WITH DIFFERENTIAL
Basophils Absolute: 0 10*3/uL (ref 0.0–0.2)
Basos: 1 %
EOS (ABSOLUTE): 0.1 10*3/uL (ref 0.0–0.4)
Eos: 1 %
Hematocrit: 35.1 % (ref 34.0–46.6)
Hemoglobin: 11.4 g/dL (ref 11.1–15.9)
Immature Grans (Abs): 0 10*3/uL (ref 0.0–0.1)
Immature Granulocytes: 0 %
Lymphocytes Absolute: 1.8 10*3/uL (ref 0.7–3.1)
Lymphs: 20 %
MCH: 26.3 pg — ABNORMAL LOW (ref 26.6–33.0)
MCHC: 32.5 g/dL (ref 31.5–35.7)
MCV: 81 fL (ref 79–97)
Monocytes Absolute: 0.7 10*3/uL (ref 0.1–0.9)
Monocytes: 8 %
Neutrophils Absolute: 6.1 10*3/uL (ref 1.4–7.0)
Neutrophils: 70 %
RBC: 4.33 x10E6/uL (ref 3.77–5.28)
RDW: 20.1 % — ABNORMAL HIGH (ref 11.7–15.4)
WBC: 8.7 10*3/uL (ref 3.4–10.8)

## 2019-10-19 NOTE — Progress Notes (Signed)
Kindly let the patient know that her white cell count is normal.  Her bad cholesterol is improved from before but still not satisfactory.  Recommend low-fat diet and repeat cholesterol in 2 months if still not satisfactory may need to consider new injections to lower cholesterol

## 2019-10-26 ENCOUNTER — Telehealth: Payer: Self-pay | Admitting: *Deleted

## 2019-10-26 NOTE — Telephone Encounter (Signed)
Spoke with patient and informed her white cell count is normal. Her bad cholesterol is improved from before but still not satisfactory. Dr Leonie Man recommends a low-fat diet and repeat cholesterol in 2 months, and  if still not satisfactory she may need to consider new injections to lower cholesterol.  She stated she just received heart monitor in the mail; she is going to the beach [place]th and asked if she should wait or wear it while there. I advised Dr Leonie Man is out of office this week, returns next week. She stated she'll call back. Patient verbalized understanding, appreciation.

## 2019-10-31 NOTE — Telephone Encounter (Signed)
Okay to wait but he have to wear it for 30 days.

## 2019-11-19 ENCOUNTER — Encounter (INDEPENDENT_AMBULATORY_CARE_PROVIDER_SITE_OTHER): Payer: BC Managed Care – PPO

## 2019-11-19 DIAGNOSIS — I639 Cerebral infarction, unspecified: Secondary | ICD-10-CM | POA: Diagnosis not present

## 2019-12-21 ENCOUNTER — Encounter: Payer: Self-pay | Admitting: *Deleted

## 2019-12-21 NOTE — Progress Notes (Signed)
Kindly inform the patient that cardiac monitor study did not show any significant worrisome arrhythmias

## 2020-01-22 ENCOUNTER — Ambulatory Visit (INDEPENDENT_AMBULATORY_CARE_PROVIDER_SITE_OTHER): Payer: BC Managed Care – PPO | Admitting: Neurology

## 2020-01-22 ENCOUNTER — Encounter: Payer: Self-pay | Admitting: Neurology

## 2020-01-22 ENCOUNTER — Other Ambulatory Visit: Payer: Self-pay

## 2020-01-22 VITALS — BP 117/77 | HR 93 | Ht 64.0 in | Wt 131.6 lb

## 2020-01-22 DIAGNOSIS — I639 Cerebral infarction, unspecified: Secondary | ICD-10-CM | POA: Diagnosis not present

## 2020-01-22 MED ORDER — PRALUENT 75 MG/ML ~~LOC~~ SOAJ: 75.0000 mg | SUBCUTANEOUS | 11 refills | Status: DC

## 2020-01-22 NOTE — Progress Notes (Signed)
[facility] [address] [place]. [place] [address] [phone]       OFFICE FOLLOW UP VISIT NOTE  [name] Date of Birth:  Jun 11, 1980 Medical Record Number:  [id number]   Referring MD:  Rosalin Hawking  Reason for Referral:  stroke  HPI: Initial consult 10/18/2019: Autumn Haney is a 39 year old Caucasian lady seen today for initial office consultation visit for stroke.  She is accompanied by her husband.  History is obtained from them and review of electronic medical records and I personally reviewed imaging films in PACS.  She has no significant past medical history except rare migraines who presented to [facility] on 45/15/21 with sudden onset of dizziness lightheadedness and vertigo.  She had to lie down and that she try to get up she required help to walk as a balance was off.  Her speech was also slurred and vision was blurred.  When her symptoms persisted she eventually came to the hospital.  She presented beyond time window for TPA.  CT scan of the head showed right cerebral infarct and CT angiogram was negative for any large vessel stenosis, dissection or occlusion.  She was admitted for further work-up and MRI scan next day showed multiple small infarcts in bilateral cerebellum, brainstem, left occipital lobe and left medial thalamus.  Work-up showed slight thrombocytosis with elevated platelet count of 511,000 but she had chronic iron deficiency anemia which could account for this.  Extensive work-up including lower extremity venous Dopplers, 2D echo, transesophageal echocardiogram, transcranial Doppler bubble studies were negative.  Lab work for hypercoagulable state was all negative.  Vasculitic labs were also negative.  Patient had history of 2 miscarriages but antiphospholipid antibody test was negative.  Patient symptoms resolved quite significantly within 24 hours she was back to baseline.  She is started on aspirin and Plavix which she took for 3  weeks without complications and is now stopped Plavix and is on aspirin alone.  Her LDL cholesterol is elevated 169 for which she was started on Lipitor 80 mg which is tolerating well without side effects.  Hemoglobin A1c was 5.6.  Patient has had no recurrent stroke or TIA symptoms.  She is returned back to work full-time without any limitations and in fact has started exercising without any issues either.  She has no prior history of strokes TIAs.  She does have history of migraines but these are infrequent and stopped after her last child 9 years ago.  There is no family history of strokes or heart attacks at a young age.  Patient has no prior history of deep vein thrombosis or pulmonary embolism. Update 01/22/2020: She returns for follow-up after last visit 3 months ago.  She accompanied by her husband.  She is doing well.  She has had no recurrent TIA or stroke symptoms.  She remains on aspirin 81 mg which is tolerating well without bruising or bleeding.  She is tolerating Lipitor 80 mg well as well and had follow-up lipid profile done at last visit on 10/18/2019 which showed LDL of 102 mg percent which had come down from 169 on 09/04/2019   but was not yet at goal.  CBC was normal.  30-day heart monitoring from 8/128/31/21 showed no evidence of paroxysmal A. fib or significant cardiac arrhythmias.  Patient states she is doing well.  She has made full recovery with no deficits.  She is working full-time without any restrictions.  She is tolerating aspirin well without bleeding and only  minor bruising.  She has in fact no complaints.  She denies any recurrent stroke or TIA symptoms. ROS:   14 system review of systems is positive for no complaints today and all other systems negative  PMH:  Past Medical History:  Diagnosis Date  . Headache    rare migraines  . Hx of varicella   . Normal pregnancy 10/25/2010    Social History:  Social History   Socioeconomic History  . Marital status: Married     Spouse name: Autumn Haney  . Number of children: 2  . Years of education: Not on file  . Highest education level: Master's degree (e.g., MA, MS, MEng, MEd, MSW, MBA)  Occupational History  . Not on file  Tobacco Use  . Smoking status: Never Smoker  . Smokeless tobacco: Never Used  Substance and Sexual Activity  . Alcohol use: Yes    Alcohol/week: 2.0 standard drinks    Types: 1 Glasses of wine, 1 Cans of beer per week    Comment: occasionally  . Drug use: No  . Sexual activity: Yes  Other Topics Concern  . Not on file  Social History Narrative   Lives with husband and 2 children   Right Handed    Drinks 1 cup caffeine daily   Social Determinants of Health   Financial Resource Strain:   . Difficulty of Paying Living Expenses: Not on file  Food Insecurity:   . Worried About Charity fundraiser in the Last Year: Not on file  . Ran Out of Food in the Last Year: Not on file  Transportation Needs:   . Lack of Transportation (Medical): Not on file  . Lack of Transportation (Non-Medical): Not on file  Physical Activity:   . Days of Exercise per Week: Not on file  . Minutes of Exercise per Session: Not on file  Stress:   . Feeling of Stress : Not on file  Social Connections:   . Frequency of Communication with Friends and Family: Not on file  . Frequency of Social Gatherings with Friends and Family: Not on file  . Attends Religious Services: Not on file  . Active Member of Clubs or Organizations: Not on file  . Attends Archivist Meetings: Not on file  . Marital Status: Not on file  Intimate Partner Violence:   . Fear of Current or Ex-Partner: Not on file  . Emotionally Abused: Not on file  . Physically Abused: Not on file  . Sexually Abused: Not on file    Medications:   Current Outpatient Medications on File Prior to Visit  Medication Sig Dispense Refill  . aspirin 81 MG EC tablet Take 1 tablet (81 mg total) by mouth daily. 360 tablet 0  . Multiple Vitamin  (MULTIVITAMIN) tablet Take 1 tablet by mouth daily.    . naproxen sodium (ALEVE) 220 MG tablet Take 440 mg by mouth 2 (two) times daily as needed (headache).    Marland Kitchen atorvastatin (LIPITOR) 80 MG tablet Take 1 tablet (80 mg total) by mouth daily. 90 tablet 0  . ferrous sulfate 325 (65 FE) MG tablet Take 1 tablet (325 mg total) by mouth daily with breakfast. 90 tablet 0   No current facility-administered medications on file prior to visit.    Allergies:  No Known Allergies  Physical Exam General: well developed, well nourished young Caucasian lady, seated, in no evident distress Head: head normocephalic and atraumatic.   Neck: supple with no carotid or supraclavicular bruits Cardiovascular:  regular rate and rhythm, no murmurs Musculoskeletal: no deformity Skin:  no rash/petichiae Vascular:  Normal pulses all extremities  Neurologic Exam Mental Status: Awake and fully alert. Oriented to place and time. Recent and remote memory intact. Attention span, concentration and fund of knowledge appropriate. Mood and affect appropriate.  Cranial Nerves: Fundoscopic exam not done. Pupils equal, briskly reactive to light. Extraocular movements full without nystagmus. Visual fields full to confrontation. Hearing intact. Facial sensation intact. Face, tongue, palate moves normally and symmetrically.  Motor: Normal bulk and tone. Normal strength in all tested extremity muscles. Sensory.: intact to touch , pinprick , position and vibratory sensation.  Coordination: Rapid alternating movements normal in all extremities. Finger-to-nose and heel-to-shin performed accurately bilaterally. Gait and Station: Arises from chair without difficulty. Stance is normal. Gait demonstrates normal stride length and balance . Able to heel, toe and tandem walk without difficulty.  Reflexes: 1+ and symmetric. Toes downgoing.      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  No significant vascular risk factors except hyperlipidemia.  She has done very well with no residual deficits.    PLAN: I had a long d/w patient and her husband about her recent cryptogenic stroke, risk for recurrent stroke/TIAs, personally independently reviewed imaging studies and stroke evaluation results and answered questions.Continue aspirin 81 mg daily  for secondary stroke prevention and maintain strict control of hypertension with blood pressure goal below 130/90, diabetes with hemoglobin A1c goal below 6.5% and lipids with LDL cholesterol goal below 70 mg/dL. I also advised the patient to eat a healthy diet with plenty of whole grains, cereals, fruits and vegetables, exercise regularly and maintain ideal body weight.  Start Praluent 75 mg subcu every 14 days for her persistently elevated LDL despite maximum dose of Lipitor.  Followup in the future with me in 6 months or call earlier if necessary.  Greater than 50% time during this 35-minute visit was it was spent on counseling and coordination of care about her cryptogenic stroke and discussion about stroke prevention and answering questions. Antony Contras, MD  [facility] [address] [place], [place] [address]  Phone [phone] Fax [phone] Note: This document was prepared with digital dictation and possible smart phrase technology. Any transcriptional errors that result from this process are unintentional.

## 2020-01-22 NOTE — Patient Instructions (Signed)
I had a long d/w patient and her husband about her recent cryptogenic stroke, risk for recurrent stroke/TIAs, personally independently reviewed imaging studies and stroke evaluation results and answered questions.Continue aspirin 81 mg daily  for secondary stroke prevention and maintain strict control of hypertension with blood pressure goal below 130/90, diabetes with hemoglobin A1c goal below 6.5% and lipids with LDL cholesterol goal below 70 mg/dL. I also advised the patient to eat a healthy diet with plenty of whole grains, cereals, fruits and vegetables, exercise regularly and maintain ideal body weight.  Start Praluent 75 mg subcu every 14 days for her persistently elevated LDL despite maximum dose of Lipitor.  Followup in the future with me in 6 months or call earlier if necessary. Alirocumab injection What is this medicine? ALIROCUMAB (al i ROC ue mab) is known as a PCSK9 inhibitor. It is used to lower the level of cholesterol in the blood. It may be used alone or in combination with other cholesterol-lowering drugs. This drug may also be used to reduce the risk of heart attack, stroke, and certain types of chest pain (unstable angina) that may need hospitalization. This medicine may be used for other purposes; ask your health care provider or pharmacist if you have questions. COMMON BRAND NAME(S): Praluent What should I tell my health care provider before I take this medicine? They need to know if you have any of these conditions:  any unusual or allergic reaction to alirocumab, other medicines, foods, dyes, or preservatives  pregnant or trying to get pregnant  breast-feeding How should I use this medicine? This medicine is for injection under the skin. You will be taught how to prepare and give this medicine. Use exactly as directed. Take your medicine at regular intervals. Do not take your medicine more often than directed. It is important that you put your used needles and syringes in a  special sharps container. Do not put them in a trash can. If you do not have a sharps container, call your pharmacist or healthcare provider to get one. Talk to your pediatrician regarding the use of this medicine in children. Special care may be needed. Overdosage: If you think you have taken too much of this medicine contact a poison control center or emergency room at once. NOTE: This medicine is only for you. Do not share this medicine with others. What if I miss a dose? If you are on an every 2 week schedule and miss a dose, take it as soon as you can. If your next dose is to be taken in less than 7 days, then do not take the missed dose. Take the next dose at your regular time. Do not take double or extra doses. If you are on a monthly schedule and miss a dose, take it as soon as you can. If the dose given is within 7 days of the missed dose, continue with your regular monthly schedule. If the missed dose is administered after 7 days of the original date, administer the dose and start a new monthly schedule based on this date. What may interact with this medicine? Interactions are not expected. This list may not describe all possible interactions. Give your health care provider a list of all the medicines, herbs, non-prescription drugs, or dietary supplements you use. Also tell them if you smoke, drink alcohol, or use illegal drugs. Some items may interact with your medicine. What should I watch for while using this medicine? You may need blood work done while you  are taking this medicine. What side effects may I notice from receiving this medicine? Side effects that you should report to your doctor or health care professional as soon as possible:  allergic reactions like skin rash, itching or hives, swelling of the face, lips, or tongue  signs and symptoms of infection like fever or chills; cough; sore throat; pain or trouble passing urine  signs and symptoms of liver injury like dark yellow  or brown urine; general ill feeling or flu-like symptoms; light-colored stools; loss of appetite; nausea; right upper belly pain; unusually weak or tired; yellowing of the eyes or skin Side effects that usually do not require medical attention (report to your doctor or health care professional if they continue or are bothersome):  diarrhea  muscle cramps  muscle pain  pain, redness, or irritation at site where injected This list may not describe all possible side effects. Call your doctor for medical advice about side effects. You may report side effects to FDA at 1-800-FDA-1088. Where should I keep my medicine? Keep out of the reach of children. You will be instructed on how to store this medicine. Throw away any unused medicine after the expiration date on the label. NOTE: This sheet is a summary. It may not cover all possible information. If you have questions about this medicine, talk to your doctor, pharmacist, or health care provider.  2020 Elsevier/Gold Standard (2017-08-19 22:02:52)

## 2020-01-23 ENCOUNTER — Telehealth: Payer: Self-pay | Admitting: Emergency Medicine

## 2020-01-23 NOTE — Telephone Encounter (Signed)
PA sent in for Praluent 75 mg/ml through [facility].  Key [id number].  Awaiting determination.

## 2020-01-24 NOTE — Telephone Encounter (Signed)
Praluent approved through 01/23/2021.    KeyVerlee Rossetti - PA Case ID: [id number] - Rx #: [id number]  Patient notified through [facility].

## 2020-07-22 ENCOUNTER — Other Ambulatory Visit: Payer: Self-pay

## 2020-07-22 ENCOUNTER — Ambulatory Visit: Payer: BC Managed Care – PPO | Admitting: Neurology

## 2020-07-22 ENCOUNTER — Encounter: Payer: Self-pay | Admitting: Neurology

## 2020-07-22 VITALS — BP 139/80 | HR 79 | Ht 64.0 in | Wt 136.2 lb

## 2020-07-22 DIAGNOSIS — I6990 Unspecified sequelae of unspecified cerebrovascular disease: Secondary | ICD-10-CM

## 2020-07-22 NOTE — Patient Instructions (Signed)
I had a long d/w patient about his recent stroke, risk for recurrent stroke/TIAs, personally independently reviewed imaging studies and stroke evaluation results and answered questions.Continue aspirin 81 mg daily  for secondary stroke prevention and maintain strict control of hypertension with blood pressure goal below 130/90, diabetes with hemoglobin A1c goal below 6.5% and lipids with LDL cholesterol goal below 70 mg/dL. I also advised the patient to eat a healthy diet with plenty of whole grains, cereals, fruits and vegetables, exercise regularly and maintain ideal body weight check follow-up lipid profile and hemoglobin A1c today and if satisfactory will stop Lipitor and then recheck in 2 months.Followup in the future with me in 1 year or call earlier if necessary.

## 2020-07-22 NOTE — Progress Notes (Signed)
[facility] [address] [place]. [place] [address] [phone]       OFFICE FOLLOW UP VISIT NOTE  [name] Date of Birth:  Jun 11, 1980 Medical Record Number:  [id number]   Referring MD:  Rosalin Hawking  Reason for Referral:  stroke  HPI: Initial consult 10/18/2019: Autumn Haney is a 40 year old Caucasian lady seen today for initial office consultation visit for stroke.  She is accompanied by her husband.  History is obtained from them and review of electronic medical records and I personally reviewed imaging films in PACS.  She has no significant past medical history except rare migraines who presented to [facility] on 45/15/21 with sudden onset of dizziness lightheadedness and vertigo.  She had to lie down and that she try to get up she required help to walk as a balance was off.  Her speech was also slurred and vision was blurred.  When her symptoms persisted she eventually came to the hospital.  She presented beyond time window for TPA.  CT scan of the head showed right cerebral infarct and CT angiogram was negative for any large vessel stenosis, dissection or occlusion.  She was admitted for further work-up and MRI scan next day showed multiple small infarcts in bilateral cerebellum, brainstem, left occipital lobe and left medial thalamus.  Work-up showed slight thrombocytosis with elevated platelet count of 511,000 but she had chronic iron deficiency anemia which could account for this.  Extensive work-up including lower extremity venous Dopplers, 2D echo, transesophageal echocardiogram, transcranial Doppler bubble studies were negative.  Lab work for hypercoagulable state was all negative.  Vasculitic labs were also negative.  Patient had history of 2 miscarriages but antiphospholipid antibody test was negative.  Patient symptoms resolved quite significantly within 24 hours she was back to baseline.  She is started on aspirin and Plavix which she took for 3  weeks without complications and is now stopped Plavix and is on aspirin alone.  Her LDL cholesterol is elevated 169 for which she was started on Lipitor 80 mg which is tolerating well without side effects.  Hemoglobin A1c was 5.6.  Patient has had no recurrent stroke or TIA symptoms.  She is returned back to work full-time without any limitations and in fact has started exercising without any issues either.  She has no prior history of strokes TIAs.  She does have history of migraines but these are infrequent and stopped after her last child 9 years ago.  There is no family history of strokes or heart attacks at a young age.  Patient has no prior history of deep vein thrombosis or pulmonary embolism. Update 01/22/2020: She returns for follow-up after last visit 3 months ago.  She accompanied by her husband.  She is doing well.  She has had no recurrent TIA or stroke symptoms.  She remains on aspirin 81 mg which is tolerating well without bruising or bleeding.  She is tolerating Lipitor 80 mg well as well and had follow-up lipid profile done at last visit on 10/18/2019 which showed LDL of 102 mg percent which had come down from 169 on 09/04/2019   but was not yet at goal.  CBC was normal.  30-day heart monitoring from 8/128/31/21 showed no evidence of paroxysmal A. fib or significant cardiac arrhythmias.  Patient states she is doing well.  She has made full recovery with no deficits.  She is working full-time without any restrictions.  She is tolerating aspirin well without bleeding and only  minor bruising.  She has in fact no complaints.  She denies any recurrent stroke or TIA symptoms. Update 07/22/2020: She returns for follow-up after last visit in October 2021.  She is accompanied by her husband.  Patient continues to do well.  She has had no recurrent stroke or TIA symptoms following her stroke in May 2021.  She remains on aspirin which is tolerating well without bleeding or bruising.  She is also on Lipitor 80 mg  and tolerating it well without muscle aches and pains.  She has had no recurrent stroke or TIA symptoms.  Blood pressures well controlled today it is 139/80.  She has no complaints. ROS:   14 system review of systems is positive for no complaints today and all other systems negative  PMH:  Past Medical History:  Diagnosis Date  . Headache    rare migraines  . Hx of varicella   . Normal pregnancy 10/25/2010    Social History:  Social History   Socioeconomic History  . Marital status: Married    Spouse name: Merry Proud  . Number of children: 2  . Years of education: Not on file  . Highest education level: Master's degree (e.g., MA, MS, MEng, MEd, MSW, MBA)  Occupational History  . Occupation: full time  Tobacco Use  . Smoking status: Never Smoker  . Smokeless tobacco: Never Used  Substance and Sexual Activity  . Alcohol use: Yes    Alcohol/week: 2.0 standard drinks    Types: 1 Glasses of wine, 1 Cans of beer per week    Comment: occasionally  . Drug use: No  . Sexual activity: Yes  Other Topics Concern  . Not on file  Social History Narrative   Lives with husband and 2 children   Right Handed    Drinks 1 cup caffeine daily   Social Determinants of Health   Financial Resource Strain: Not on file  Food Insecurity: Not on file  Transportation Needs: Not on file  Physical Activity: Not on file  Stress: Not on file  Social Connections: Not on file  Intimate Partner Violence: Not on file    Medications:   Current Outpatient Medications on File Prior to Visit  Medication Sig Dispense Refill  . Alirocumab (PRALUENT) 75 MG/ML SOAJ Inject 75 mg into the skin every 14 (fourteen) days. 1 mL 11  . aspirin 81 MG EC tablet Take 1 tablet (81 mg total) by mouth daily. 360 tablet 0  . Multiple Vitamin (MULTIVITAMIN) tablet Take 1 tablet by mouth daily.    . naproxen sodium (ALEVE) 220 MG tablet Take 440 mg by mouth 2 (two) times daily as needed (headache).    Marland Kitchen atorvastatin (LIPITOR)  80 MG tablet Take 1 tablet (80 mg total) by mouth daily. 90 tablet 0  . ferrous sulfate 325 (65 FE) MG tablet Take 1 tablet (325 mg total) by mouth daily with breakfast. 90 tablet 0   No current facility-administered medications on file prior to visit.    Allergies:  No Known Allergies  Physical Exam General: well developed, well nourished young Caucasian lady, seated, in no evident distress Head: head normocephalic and atraumatic.   Neck: supple with no carotid or supraclavicular bruits Cardiovascular: regular rate and rhythm, no murmurs Musculoskeletal: no deformity Skin:  no rash/petichiae Vascular:  Normal pulses all extremities  Neurologic Exam Mental Status: Awake and fully alert. Oriented to place and time. Recent and remote memory intact. Attention span, concentration and fund of knowledge appropriate. Mood and  affect appropriate.  Cranial Nerves: Fundoscopic exam not done. Pupils equal, briskly reactive to light. Extraocular movements full without nystagmus. Visual fields full to confrontation. Hearing intact. Facial sensation intact. Face, tongue, palate moves normally and symmetrically.  Motor: Normal bulk and tone. Normal strength in all tested extremity muscles. Sensory.: intact to touch , pinprick , position and vibratory sensation.  Coordination: Rapid alternating movements normal in all extremities. Finger-to-nose and heel-to-shin performed accurately bilaterally. Gait and Station: Arises from chair without difficulty. Stance is normal. Gait demonstrates normal stride length and balance . Able to heel, toe and tandem walk without difficulty.  Reflexes: 1+ and symmetric. Toes downgoing.      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  No significant vascular risk factors except hyperlipidemia.  She continues to do very well with no residual deficits.    PLAN: I had a long d/w patient  about his recent stroke, risk for recurrent stroke/TIAs, personally independently reviewed imaging studies and stroke evaluation results and answered questions.Continue aspirin 81 mg daily  for secondary stroke prevention and maintain strict control of hypertension with blood pressure goal below 130/90, diabetes with hemoglobin A1c goal below 6.5% and lipids with LDL cholesterol goal below 70 mg/dL. I also advised the patient to eat a healthy diet with plenty of whole grains, cereals, fruits and vegetables, exercise regularly and maintain ideal body weight check follow-up lipid profile and hemoglobin A1c today and if satisfactory will stop Lipitor and then recheck in 2 months.Followup in the future with me in 1 year or call earlier if necessary.  Greater than 50% time during this 25-minute visit was it was spent on counseling and coordination of care about her cryptogenic stroke and discussion about stroke prevention and answering questions. Antony Contras, MD  [facility] [address] [place], [place] [address]  Phone [phone] Fax [phone] Note: This document was prepared with digital dictation and possible smart phrase technology. Any transcriptional errors that result from this process are unintentional.

## 2020-07-23 LAB — HEMOGLOBIN A1C
Est. average glucose Bld gHb Est-mCnc: 108 mg/dL
Hgb A1c MFr Bld: 5.4 % (ref 4.8–5.6)

## 2020-07-23 LAB — LIPID PANEL
Chol/HDL Ratio: 1.6 ratio (ref 0.0–4.4)
Cholesterol, Total: 113 mg/dL (ref 100–199)
HDL: 70 mg/dL (ref >39)
LDL Chol Calc (NIH): 33 mg/dL (ref 0–99)
Triglycerides: 36 mg/dL (ref 0–149)
VLDL Cholesterol Cal: 10 mg/dL (ref 5–40)

## 2020-07-29 NOTE — Progress Notes (Signed)
Kindly inform the patient that lab work for cholesterol profile and screening test for diabetes were both satisfactory

## 2020-07-30 ENCOUNTER — Encounter: Payer: Self-pay | Admitting: *Deleted

## 2020-12-26 ENCOUNTER — Other Ambulatory Visit: Payer: Self-pay | Admitting: Neurology

## 2021-01-13 IMAGING — MR MR HEAD W/O CM
12 of 13 series · 44 of 48 positions shown · non-contrast
Comparison: CTA head neck 09/02/2019

CLINICAL DATA: Sudden onset dizziness with slurred speech and gait
disturbance

EXAM:
MRI HEAD WITHOUT CONTRAST
TECHNIQUE: Multiplanar, multiecho pulse sequences of the brain and surrounding
structures were obtained without intravenous contrast.

[Series 5: DWI · axial · 3.0mm · 0.85mm/px · z∈[-81,+54]mm · 7 of 92 slices shown (1 of 4)]
[im 1/92]
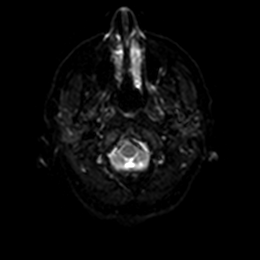
[im 16/92]
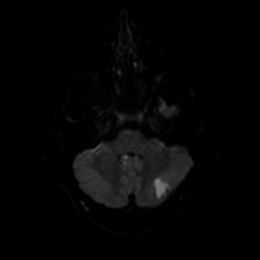
[im 31/92]
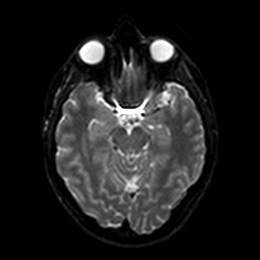
[im 46/92]
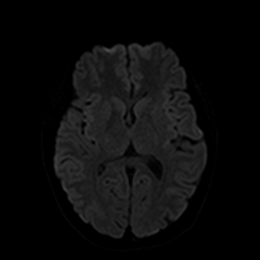
[im 61/92]
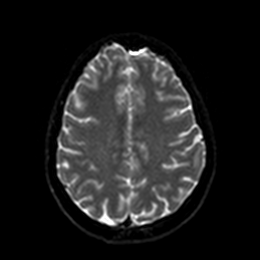
[im 76/92]
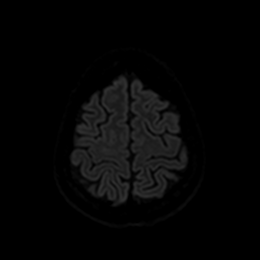
[im 92/92]
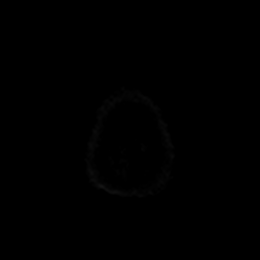

[Series 6: DWI · axial · 3.0mm · 0.85mm/px · z∈[-81,+54]mm · 4 of 46 slices shown (2 of 4)]
[im 1/46]
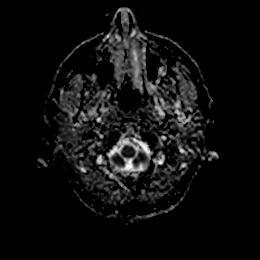
[im 16/46]
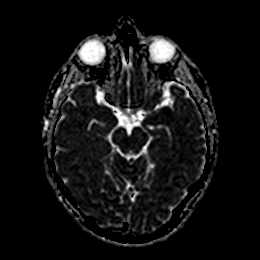
[im 31/46]
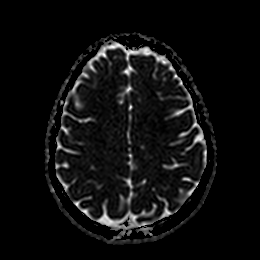
[im 46/46]
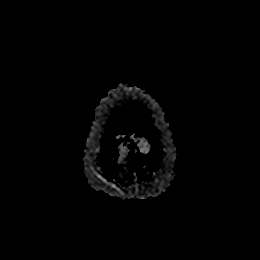

[Series 7: DWI · coronal · 4.0mm · 0.88mm/px · 6 of 70 slices shown (3 of 4)]
[im 1/70]
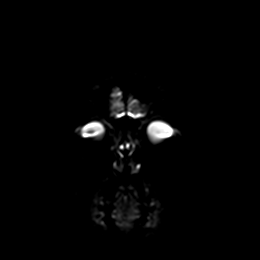
[im 14/70]
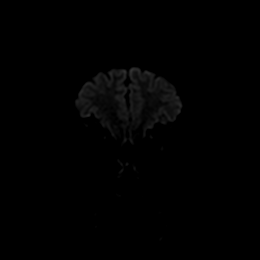
[im 28/70]
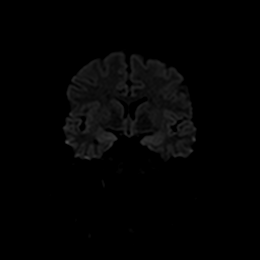
[im 42/70]
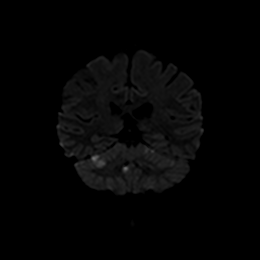
[im 56/70]
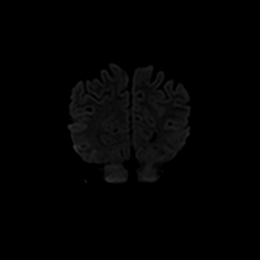
[im 70/70]
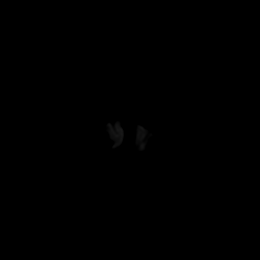

[Series 8: DWI · coronal · 4.0mm · 0.88mm/px · 3 of 35 slices shown (4 of 4)]
[im 1/35]
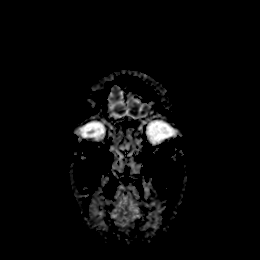
[im 18/35]
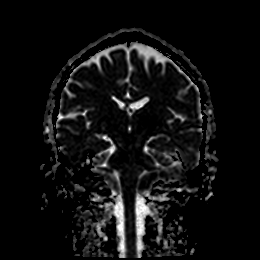
[im 35/35]
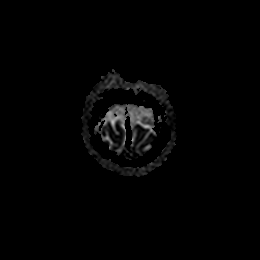

[Series 9: T1 · sagittal · 5.0mm · 0.69mm/px · 2 of 25 slices shown]
[im 1/25]
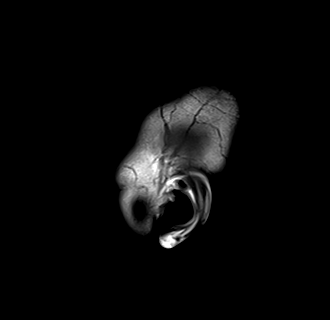
[im 25/25]
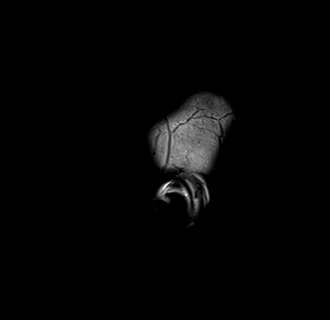

[Series 10: T2 · axial · 5.0mm · 0.69mm/px · z∈[-85,+59]mm · 2 of 25 slices shown (1 of 2)]
[im 1/25]
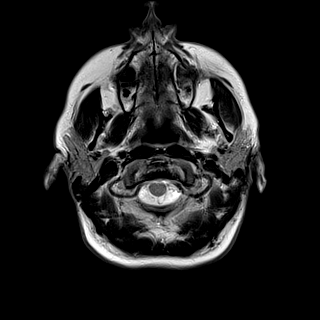
[im 25/25]
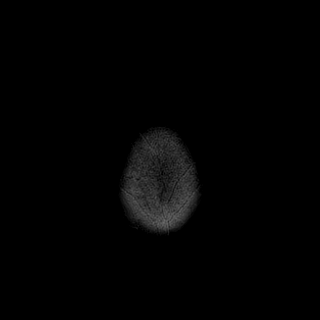

[Series 11: FLAIR · axial · 5.0mm · 0.86mm/px · z∈[-85,+59]mm · 2 of 25 slices shown]
[im 1/25]
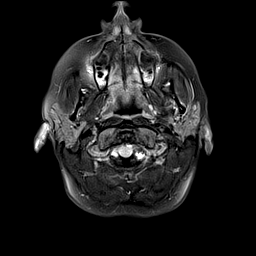
[im 25/25]
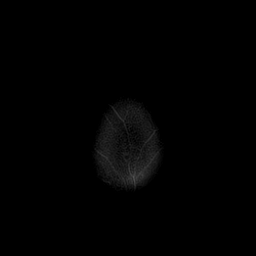

[Series 12: mag_images · axial · 3.0mm · 0.90mm/px · z∈[-89,+64]mm · 4 of 52 slices shown]
[im 1/52]
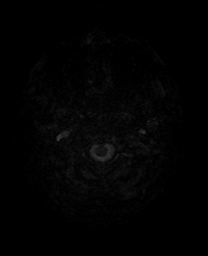
[im 18/52]
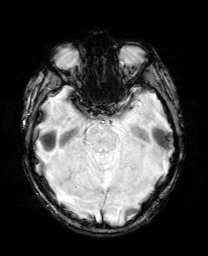
[im 35/52]
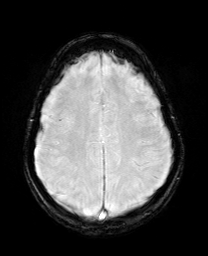
[im 52/52]
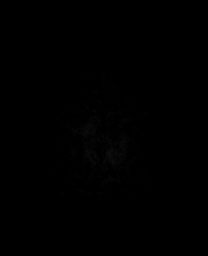

[Series 13: pha_images · axial · 3.0mm · 0.90mm/px · z∈[-89,+64]mm · 4 of 52 slices shown]
[im 1/52]
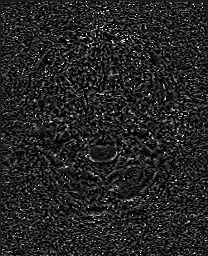
[im 18/52]
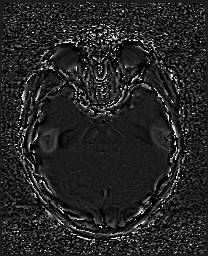
[im 35/52]
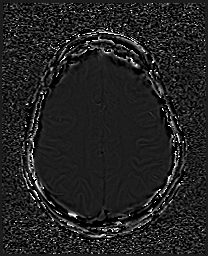
[im 52/52]
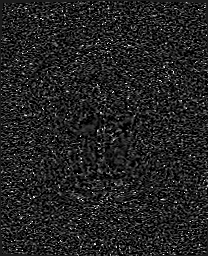

[Series 14: swi_images · axial · 3.0mm · 0.90mm/px · z∈[-89,+64]mm · 4 of 52 slices shown]
[im 1/52]
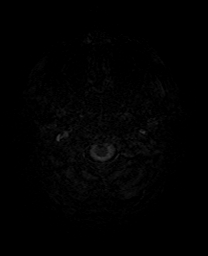
[im 18/52]
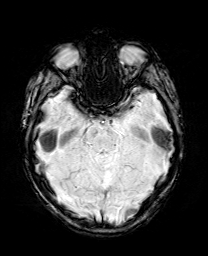
[im 35/52]
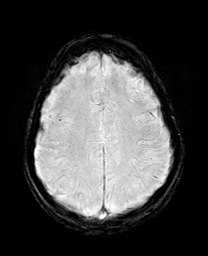
[im 52/52]
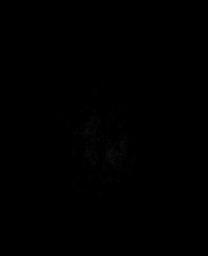

[Series 15: mip_images(sw) · axial · 24.0mm · 0.90mm/px · z∈[-78,+53]mm · 4 of 45 slices shown]
[im 1/45]
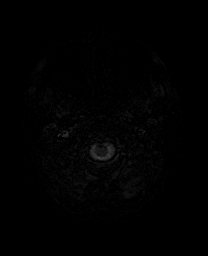
[im 15/45]
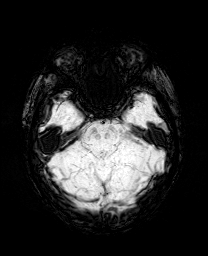
[im 30/45]
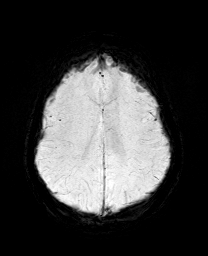
[im 45/45]
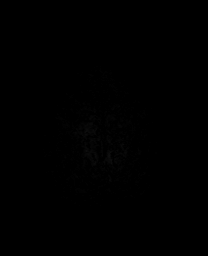

[Series 17: T2 · coronal · 5.0mm · 0.34mm/px · 2 of 31 slices shown (2 of 2)]
[im 1/31]
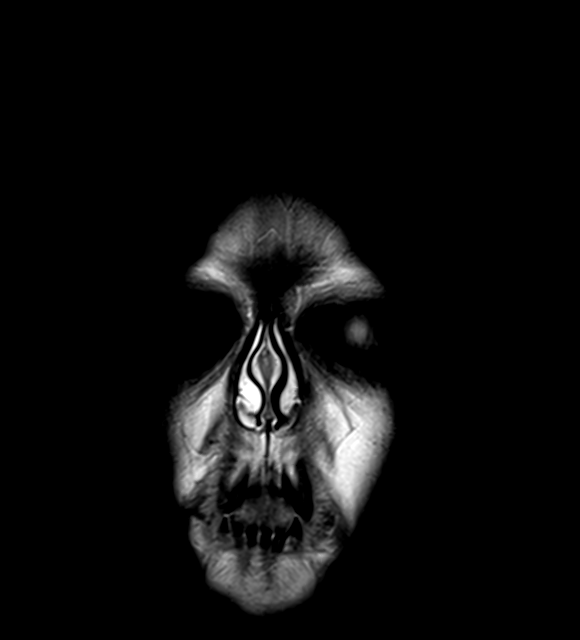
[im 31/31]
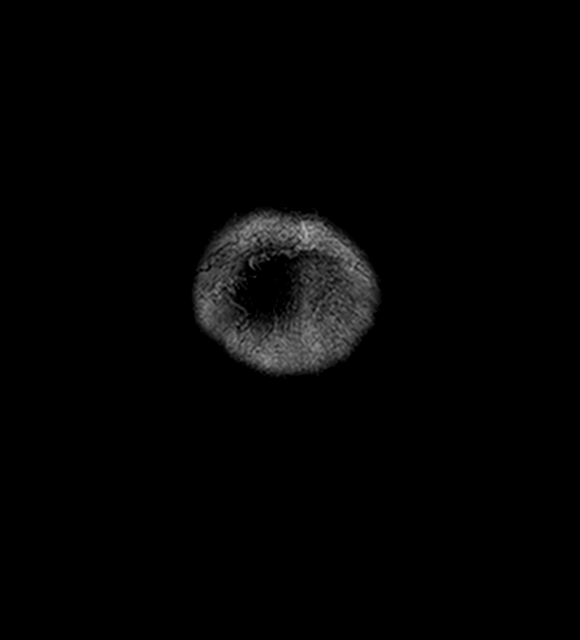

[44 of 48 positions shown; findings below may reference images not displayed]

FINDINGS: Brain: There are numerous foci of abnormal diffusion restriction
throughout both cerebellar hemispheres and within the brainstem.
There are a few scattered foci within the left PCA territory,
including the left thalamus and occipital lobe. Cytotoxic edema at
the infarct sites. No mass effect. Normal volume of CSF spaces. No
chronic microhemorrhage. Normal midline structures.

Vascular: Normal flow voids.

Skull and upper cervical spine: Normal marrow signal.

Sinuses/Orbits: Negative.

Other: None.
IMPRESSION: 1. Numerous foci of acute to early subacute ischemia throughout both
cerebellar hemispheres, the brainstem and, to a lesser extent, the
left PCA territory.
2. No mass effect or hemorrhage.

## 2021-02-24 MED ORDER — PRALUENT 75 MG/ML ~~LOC~~ SOAJ: SUBCUTANEOUS | 11 refills | Status: DC

## 2021-02-26 ENCOUNTER — Telehealth: Payer: Self-pay | Admitting: *Deleted

## 2021-02-26 NOTE — Telephone Encounter (Signed)
Unable to start prior authorization on covermymeds (issue with site). Started PA over the phone with [facility] [phone]). FP#[id number] approved through 02/26/2022. Notified patient through mychart.

## 2021-06-02 ENCOUNTER — Telehealth: Payer: Self-pay | Admitting: Neurology

## 2021-06-02 NOTE — Telephone Encounter (Signed)
VM left and mychart msg sent- MD out of office.

## 2021-07-23 ENCOUNTER — Ambulatory Visit: Payer: BC Managed Care – PPO | Admitting: Neurology

## 2021-08-04 ENCOUNTER — Encounter: Payer: Self-pay | Admitting: Neurology

## 2021-08-04 ENCOUNTER — Ambulatory Visit: Payer: BC Managed Care – PPO | Admitting: Neurology

## 2021-08-04 VITALS — BP 113/70 | HR 86 | Ht 64.0 in | Wt 148.0 lb

## 2021-08-04 DIAGNOSIS — Z8673 Personal history of transient ischemic attack (TIA), and cerebral infarction without residual deficits: Secondary | ICD-10-CM | POA: Diagnosis not present

## 2021-08-04 NOTE — Patient Instructions (Signed)
I had a long d/w patient about his recent stroke, risk for recurrent stroke/TIAs, personally independently reviewed imaging studies and stroke evaluation results and answered questions.Continue aspirin 81 mg daily  for secondary stroke prevention and maintain strict control of hypertension with blood pressure goal below 130/90, diabetes with hemoglobin A1c goal below 6.5% and lipids with LDL cholesterol goal below 70 mg/dL. I also advised the patient to eat a healthy diet with plenty of whole grains, cereals, fruits and vegetables, exercise regularly and maintain ideal body weight Followup in the future with me only as needed  and no scheduled appointment is necessary. ? ?

## 2021-08-04 NOTE — Progress Notes (Signed)
?[facility] ?[address] ?[place]. [place] [address] ?[phone] ? ?     OFFICE FOLLOW UP VISIT NOTE ? ?[name] ?Date of Birth:  [birth year] ?Medical Record Number:  [id number]  ? ?Referring MD:  Rosalin Hawking ? ?Reason for Referral:  stroke ? ?HPI: Initial consult 10/18/2019: [name] is a 41 year old Caucasian lady seen today for initial office consultation visit for stroke.  She is accompanied by her husband.  History is obtained from them and review of electronic medical records and I personally reviewed imaging films in PACS.  She has no significant past medical history except rare migraines who presented to [facility] on 45/15/21 with sudden onset of dizziness lightheadedness and vertigo.  She had to lie down and that she try to get up she required help to walk as a balance was off.  Her speech was also slurred and vision was blurred.  When her symptoms persisted she eventually came to the hospital.  She presented beyond time window for TPA.  CT scan of the head showed right cerebral infarct and CT angiogram was negative for any large vessel stenosis, dissection or occlusion.  She was admitted for further work-up and MRI scan next day showed multiple small infarcts in bilateral cerebellum, brainstem, left occipital lobe and left medial thalamus.  Work-up showed slight thrombocytosis with elevated platelet count of 511,000 but she had chronic iron deficiency anemia which could account for this.  Extensive work-up including lower extremity venous Dopplers, 2D echo, transesophageal echocardiogram, transcranial Doppler bubble studies were negative.  Lab work for hypercoagulable state was all negative.  Vasculitic labs were also negative.  Patient had history of 2 miscarriages but antiphospholipid antibody test was negative.  Patient symptoms resolved quite significantly within 24 hours she was back to baseline.  She is started on aspirin and Plavix which she took for 3  weeks without complications and is now stopped Plavix and is on aspirin alone.  Her LDL cholesterol is elevated 169 for which she was started on Lipitor 80 mg which is tolerating well without side effects.  Hemoglobin A1c was 5.6.  Patient has had no recurrent stroke or TIA symptoms.  She is returned back to work full-time without any limitations and in fact has started exercising without any issues either.  She has no prior history of strokes TIAs.  She does have history of migraines but these are infrequent and stopped after her last child 9 years ago.  There is no family history of strokes or heart attacks at a young age.  Patient has no prior history of deep vein thrombosis or pulmonary embolism. ?Update 01/22/2020: She returns for follow-up after last visit 3 months ago.  She accompanied by her husband.  She is doing well.  She has had no recurrent TIA or stroke symptoms.  She remains on aspirin 81 mg which is tolerating well without bruising or bleeding.  She is tolerating Lipitor 80 mg well as well and had follow-up lipid profile done at last visit on 10/18/2019 which showed LDL of 102 mg percent which had come down from 169 on 09/04/2019   but was not yet at goal.  CBC was normal.  30-day heart monitoring from 8/128/31/21 showed no evidence of paroxysmal A. fib or significant cardiac arrhythmias.  Patient states she is doing well.  She has made full recovery with no deficits.  She is working full-time without any restrictions.  She is tolerating aspirin well without bleeding and only  minor bruising.  She has in fact no complaints.  She denies any recurrent stroke or TIA symptoms. ?Update 07/22/2020: She returns for follow-up after last visit in October 2021.  She is accompanied by her husband.  Patient continues to do well.  She has had no recurrent stroke or TIA symptoms following her stroke in May 2021.  She remains on aspirin which is tolerating well without bleeding or bruising.  She is also on Lipitor 80 mg  and tolerating it well without muscle aches and pains.  She has had no recurrent stroke or TIA symptoms.  Blood pressures well controlled today it is 139/80.  She has no complaints. ?Update 08/04/2021 : She returns for follow-up after last visit a year ago.  She is doing well.  She has had no recurrent TIA or stroke symptoms since May 2021.  She remains on aspirin which is tolerating well without bruising or bleeding.  Her cholesterol is not adequately controlled on Lipitor so I switched her to Praluent and she is tolerating it well without side effects.  Last lipid profile was quite low so we discontinued Lipitor and now she is on Praluent alone.  She is active and exercises 4 days a week at the gym.  She has not gained any weight.  Her blood pressure is well controlled today it is 113/70.  No new complaints. ?ROS:   ?14 system review of systems is positive for no complaints today and all other systems negative ? ?PMH:  ?Past Medical History:  ?Diagnosis Date  ? Headache   ? rare migraines  ? Hx of varicella   ? Normal pregnancy 10/25/2010  ? ? ?Social History:  ?Social History  ? ?Socioeconomic History  ? Marital status: Married  ?  Spouse name: Merry Proud  ? Number of children: 2  ? Years of education: Not on file  ? Highest education level: Master's degree (e.g., MA, MS, MEng, MEd, MSW, MBA)  ?Occupational History  ? Occupation: full time  ?Tobacco Use  ? Smoking status: Never  ? Smokeless tobacco: Never  ?Substance and Sexual Activity  ? Alcohol use: Yes  ?  Alcohol/week: 2.0 standard drinks  ?  Types: 1 Glasses of wine, 1 Cans of beer per week  ?  Comment: occasionally  ? Drug use: No  ? Sexual activity: Yes  ?Other Topics Concern  ? Not on file  ?Social History Narrative  ? Lives with husband and 2 children  ? Right Handed   ? Drinks 1 cup caffeine daily  ? ?Social Determinants of Health  ? ?Financial Resource Strain: Not on file  ?Food Insecurity: Not on file  ?Transportation Needs: Not on file  ?Physical Activity:  Not on file  ?Stress: Not on file  ?Social Connections: Not on file  ?Intimate Partner Violence: Not on file  ? ? ?Medications:   ?Current Outpatient Medications on File Prior to Visit  ?Medication Sig Dispense Refill  ? Alirocumab (PRALUENT) 75 MG/ML SOAJ INJECT 75 MG INTO THE SKIN EVERY 14 DAYS 2 mL 11  ? Multiple Vitamin (MULTIVITAMIN) tablet Take 1 tablet by mouth daily.    ? naproxen sodium (ALEVE) 220 MG tablet Take 440 mg by mouth 2 (two) times daily as needed (headache).    ? ?No current facility-administered medications on file prior to visit.  ? ? ?Allergies:  No Known Allergies ? ?Physical Exam ?General: well developed, well nourished young Caucasian lady, seated, in no evident distress ?Head: head normocephalic and atraumatic.   ?  Neck: supple with no carotid or supraclavicular bruits ?Cardiovascular: regular rate and rhythm, no murmurs ?Musculoskeletal: no deformity ?Skin:  no rash/petichiae ?Vascular:  Normal pulses all extremities ? ?Neurologic Exam ?Mental Status: Awake and fully alert. Oriented to place and time. Recent and remote memory intact. Attention span, concentration and fund of knowledge appropriate. Mood and affect appropriate.  ?Cranial Nerves: Fundoscopic exam not done. Pupils equal, briskly reactive to light. Extraocular movements full without nystagmus. Visual fields full to confrontation. Hearing intact. Facial sensation intact. Face, tongue, palate moves normally and symmetrically.  ?Motor: Normal bulk and tone. Normal strength in all tested extremity muscles. ?Sensory.: intact to touch , pinprick , position and vibratory sensation.  ?Coordination: Rapid alternating movements normal in all extremities. Finger-to-nose and heel-to-shin performed accurately bilaterally. ?Gait and Station: Arises from chair without difficulty. Stance is normal. Gait demonstrates normal stride length and balance . Able to heel, toe and tandem walk without difficulty.  ?Reflexes: 1+ and symmetric. Toes  downgoing.  ? ?  ? 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  No significant vascular risk factors except hyperlipide

## 2021-10-13 ENCOUNTER — Ambulatory Visit: Payer: BC Managed Care – PPO | Admitting: Neurology

## 2021-11-03 ENCOUNTER — Telehealth: Payer: Self-pay | Admitting: Neurology

## 2021-11-03 ENCOUNTER — Encounter: Payer: Self-pay | Admitting: Neurology

## 2021-11-03 NOTE — Telephone Encounter (Signed)
Patient has been called. Phone note in [place].

## 2021-11-03 NOTE — Telephone Encounter (Signed)
I spoke to the patient. She verbalized understanding. She is going to contact her PCP office to discuss a new prescription for Repatha.

## 2021-11-03 NOTE — Telephone Encounter (Signed)
[facility]) checking on if your office has received PA for Alirocumab (PRALUENT) 75 MG/ML SOAJ. Would like a call back.

## 2021-11-03 NOTE — Telephone Encounter (Addendum)
Also, received a mychart message about this medication. I contacted [facility] at [phone]. Per the rep, the patient's plan has changed and Praluent is not longer preferred. She must try Repatha. It is available without a prior authorization.   I have left a voicemail asking for a return call from the patient. She was last seen 08/04/21 and released back to her PCP care.

## 2021-11-03 NOTE — Telephone Encounter (Signed)
I returned the call to [place] and spoke to [place]. She has been provided with the update below.

## 2021-12-05 MED ORDER — REPATHA SURECLICK 140 MG/ML ~~LOC~~ SOAJ: 140.0000 mg | SUBCUTANEOUS | 1 refills | Status: AC

## 2022-01-30 ENCOUNTER — Other Ambulatory Visit: Payer: Self-pay | Admitting: Neurology

## 2023-12-22 ENCOUNTER — Encounter (HOSPITAL_BASED_OUTPATIENT_CLINIC_OR_DEPARTMENT_OTHER): Payer: Self-pay

## 2023-12-22 ENCOUNTER — Ambulatory Visit (HOSPITAL_BASED_OUTPATIENT_CLINIC_OR_DEPARTMENT_OTHER): Payer: Self-pay | Admitting: Orthopaedic Surgery

## 2023-12-22 ENCOUNTER — Ambulatory Visit: Payer: Self-pay | Admitting: Podiatry

## 2023-12-22 ENCOUNTER — Ambulatory Visit (INDEPENDENT_AMBULATORY_CARE_PROVIDER_SITE_OTHER)

## 2023-12-22 ENCOUNTER — Encounter: Payer: Self-pay | Admitting: Podiatry

## 2023-12-22 DIAGNOSIS — M7751 Other enthesopathy of right foot: Secondary | ICD-10-CM

## 2023-12-22 DIAGNOSIS — M7750 Other enthesopathy of unspecified foot: Secondary | ICD-10-CM

## 2023-12-22 DIAGNOSIS — M7671 Peroneal tendinitis, right leg: Secondary | ICD-10-CM | POA: Diagnosis not present

## 2023-12-22 MED ORDER — MELOXICAM 15 MG PO TABS: 15.0000 mg | ORAL_TABLET | Freq: Every day | ORAL | 0 refills | Status: DC

## 2023-12-22 NOTE — Progress Notes (Signed)
  Subjective:  Patient ID: Autumn Haney, female    DOB: March 14, 1981,   MRN: [id number]  Chief Complaint  Patient presents with   Foot Pain    I pain on the side of my right foot. (Lateral)    43 y.o. female presents for concern of right foot pain that has been ongoing for about 10 days. Denies any injuries. She is very active and runs. She relates the pain has progressively worsened. She stated pain on the outside of her foot and worse with activity. She has tried icing and ibuprofen .   . Denies any other pedal complaints. Denies n/v/f/c.   Past Medical History:  Diagnosis Date   Headache    rare migraines   Hx of varicella    Normal pregnancy 10/25/2010    Objective:  Physical Exam: Vascular: DP/PT pulses 2/4 bilateral. CFT <3 seconds. Normal hair growth on digits. No edema.  Skin. No lacerations or abrasions bilateral feet.  Musculoskeletal: MMT 5/5 bilateral lower extremities in DF, PF, Inversion and Eversion. Deceased ROM in DF of ankle joint. Tender to insertion of peroneal tendon and pain with eversion. No proximally and no significant pain with inverison DF and PF.  Neurological: Sensation intact to light touch.   Assessment:   1. Peroneal tendonitis, right      Plan:  Patient was evaluated and treated and all questions answered. X-rays reviewed and discussed with patient. No acute fractures or dislocations noted  Discussed peroneal tendinitis and treatment options at length with patient Discussed stretching exercises and provided handout. Prescription for meloxicam  provided Dispensed Tri-Lock ankle brace. Discussed that if the symptoms do not improve can consider PT/MRI. Patient to return in 6 to 8 weeks or sooner if symptoms fail to improve or worsen.   [place] Failing, DPM

## 2023-12-22 NOTE — Patient Instructions (Signed)
 Peroneal Tendinopathy Rehab Ask your health care provider which exercises are safe for you. Do exercises exactly as told by your health care provider and adjust them as directed. It is normal to feel mild stretching, pulling, tightness, or discomfort as you do these exercises. Stop right away if you feel sudden pain or your pain gets worse. Do not begin these exercises until told by your health care provider. Stretching and range-of-motion exercises These exercises warm up your muscles and joints. They can help improve the movement and flexibility of your ankle. They may also help to relieve pain and stiffness. Gastrocnemius and soleus stretch, standing This is an exercise in which you stand on a step and use your body weight to stretch your calf muscles. To do this exercise: Stand on the edge of a step on the ball of your left / right foot. The ball of your foot is on the walking surface, right under your toes. Keep your other foot firmly on the same step. Hold on to the wall, a railing, or a chair for balance. Slowly lift your other foot, allowing your body weight to press your left / right heel down over the edge of the step. You should feel a stretch in your left / right calf (gastrocnemius and soleus). Hold this position for __________ seconds. Return both feet to the step. Repeat this exercise with a slight bend in your left / right knee. Repeat __________ times with your left / right knee straight and __________ times with your left / right knee bent. Complete this exercise __________ times a day. Strengthening exercises These exercises build strength and endurance in your foot and ankle. Endurance is the ability to use your muscles for a long time, even after they get tired. Ankle dorsiflexion with band  Secure a rubber exercise band or tube to an object, such as a table leg, that will not move when the band is pulled. Secure the other end of the band around your left / right foot. Sit on  the floor. Face the object with your left / right leg extended. The band or tube should be slightly tense when your foot is relaxed. Slowly flex your left / right ankle and toes to bring your foot toward you (dorsiflexion). Hold this position for __________ seconds. Let the band or tube slowly pull your foot back to the starting position. Repeat __________ times. Complete this exercise __________ times a day. Ankle eversion  Sit on the floor with your legs straight out in front of you. Loop a rubber exercise band or tube around the ball of your left / right foot. The ball of your foot is on the walking surface, right under your toes. Hold the ends of the band in your hands. You can also secure the band to a stable object. The band or tube should be slightly tense when your foot is relaxed. Slowly push your foot outward, away from your other leg (eversion). Hold this position for __________ seconds. Slowly return your foot to the starting position. Repeat __________ times. Complete this exercise __________ times a day. Plantar flexion, standing This exercise is sometimes called a standing heel raise. Stand with your feet shoulder-width apart. Place your hands on a wall or table to steady yourself as needed. Try not to use it for support. Keep your weight spread evenly over the width of your feet while you slowly rise up on your toes (plantar flexion). If told by your health care provider: Shift your weight  toward your left / right leg until you feel challenged. Stand on your left / right leg only. Hold this position for __________ seconds. Repeat __________ times. Complete this exercise __________ times a day. Single leg stand  Without shoes, stand near a railing or in a doorway. You may hold on to the railing or doorframe as needed. Stand on your left / right foot. Keep your big toe down on the floor and try to keep your arch lifted. Do not roll to the outside of your foot. If this  exercise is too easy, you can try it with your eyes closed or while standing on a pillow. Hold this position for __________ seconds. Repeat __________ times. Complete this exercise __________ times a day. This information is not intended to replace advice given to you by your health care provider. Make sure you discuss any questions you have with your health care provider. Document Revised: 07/31/2021 Document Reviewed: 07/31/2021 [employer] Patient Education  2024 [employer].

## 2024-01-19 ENCOUNTER — Other Ambulatory Visit: Payer: Self-pay | Admitting: Podiatry

## 2024-02-02 ENCOUNTER — Ambulatory Visit: Admitting: Podiatry

## 2024-02-21 ENCOUNTER — Encounter: Payer: Self-pay | Admitting: Radiology

## 2024-02-28 ENCOUNTER — Other Ambulatory Visit: Payer: Self-pay | Admitting: Obstetrics and Gynecology

## 2024-02-28 DIAGNOSIS — R928 Other abnormal and inconclusive findings on diagnostic imaging of breast: Secondary | ICD-10-CM

## 2024-03-02 ENCOUNTER — Ambulatory Visit
Admission: RE | Admit: 2024-03-02 | Discharge: 2024-03-02 | Disposition: A | Discharge status: Home / Self Care | Payer: Self-pay | Source: Ambulatory Visit | Attending: Obstetrics and Gynecology | Admitting: Obstetrics and Gynecology

## 2024-03-02 ENCOUNTER — Ambulatory Visit

## 2024-03-02 DIAGNOSIS — R928 Other abnormal and inconclusive findings on diagnostic imaging of breast: Secondary | ICD-10-CM
